# Patient Record
Sex: Female | Born: 1990 | Race: White | Hispanic: No | Marital: Single | Smoking: Heavy tobacco smoker
Health system: Southern US, Community
[De-identification: ages and names within clinical notes are randomized; demographics above are authoritative.]

---

## 2019-11-09 ENCOUNTER — Encounter (HOSPITAL_COMMUNITY): Payer: Self-pay | Admitting: Licensed Clinical Social Worker

## 2019-11-09 ENCOUNTER — Other Ambulatory Visit: Payer: Self-pay

## 2019-11-09 ENCOUNTER — Ambulatory Visit (INDEPENDENT_AMBULATORY_CARE_PROVIDER_SITE_OTHER): Payer: No Payment, Other | Admitting: Licensed Clinical Social Worker

## 2019-11-09 DIAGNOSIS — F331 Major depressive disorder, recurrent, moderate: Secondary | ICD-10-CM | POA: Diagnosis not present

## 2019-11-09 DIAGNOSIS — F900 Attention-deficit hyperactivity disorder, predominantly inattentive type: Secondary | ICD-10-CM | POA: Insufficient documentation

## 2019-11-09 DIAGNOSIS — F909 Attention-deficit hyperactivity disorder, unspecified type: Secondary | ICD-10-CM | POA: Insufficient documentation

## 2019-11-09 DIAGNOSIS — F411 Generalized anxiety disorder: Secondary | ICD-10-CM | POA: Insufficient documentation
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
## 2019-11-11 ENCOUNTER — Encounter (HOSPITAL_COMMUNITY): Payer: Self-pay | Admitting: Psychiatry

## 2019-11-11 ENCOUNTER — Other Ambulatory Visit: Payer: Self-pay

## 2019-11-11 ENCOUNTER — Telehealth (INDEPENDENT_AMBULATORY_CARE_PROVIDER_SITE_OTHER): Payer: No Payment, Other | Admitting: Psychiatry

## 2019-11-11 DIAGNOSIS — F3342 Major depressive disorder, recurrent, in full remission: Secondary | ICD-10-CM | POA: Diagnosis not present

## 2019-11-11 DIAGNOSIS — F900 Attention-deficit hyperactivity disorder, predominantly inattentive type: Secondary | ICD-10-CM | POA: Diagnosis not present

## 2019-11-11 MED ORDER — ESCITALOPRAM OXALATE 10 MG PO TABS: 10.0000 mg | ORAL_TABLET | Freq: Every day | ORAL | 2 refills | Status: DC

## 2019-11-11 MED ORDER — AMPHETAMINE-DEXTROAMPHET ER 10 MG PO CP24: 10.0000 mg | ORAL_CAPSULE | Freq: Every morning | ORAL | 0 refills | Status: DC

## 2019-11-11 NOTE — Progress Notes (Addendum)
Psychiatric Initial Adult Assessment   Virtual Visit via Video Note  I connected with Madison Chavez on 11/11/19 at  1:40 PM EDT by a video enabled telemedicine application and verified that I am speaking with the correct person using two identifiers.  Location: Patient: Home Provider: Clinic   I discussed the limitations of evaluation and management by telemedicine and the availability of in person appointments. The patient expressed understanding and agreed to proceed.  Names of all persons participating in this telemedicine service and their role in this encounter. Name: Dr. Evelene Croon Role: Psychiatrist  Name: Madison Chavez    Role: Patient           I provided  29 minutes of non-face-to-face time during this encounter.     Patient Identification: Madison Chavez MRN:  [id number] Date of Evaluation:  11/11/2019   Referral Source: Vesta Mixer  Chief Complaint:   " I am a refill from her Lexapro and also need to discuss treatment for ADHD."  Visit Diagnosis:    ICD-10-CM   1. MDD (major depressive disorder), recurrent, in full remission (HCC)  F33.42 escitalopram (LEXAPRO) 10 MG tablet  2. ADHD, predominantly inattentive type  F90.0 amphetamine-dextroamphetamine (ADDERALL XR) 10 MG 24 hr capsule    amphetamine-dextroamphetamine (ADDERALL XR) 10 MG 24 hr capsule    History of Present Illness: This is a 29 year old female with history of MDD and recently diagnosed ADHD now seen for evaluation and establishing care.  Patient was previously being managed at [facility] and was being prescribed Lexapro 10 mg daily. Patient reported that she has been on Lexapro for about 1-1/2 years now and it has been very helpful.  She informed that couple of years ago she started seeking help for her psychological issues and at that time she was misdiagnosed with bipolar disorder and prescribed Vraylar by a provider in [place].  Vraylar did not help her and caused her a lot of side effects.  She  was eventually taken off of it and then placed on Lexapro and her diagnosis was revised to MDD.  Patient informed that she has done really well with Lexapro and would like to continue taking it at the same dose. She informed that she has always struggled with focusing and poor concentration issues.  She always has had a hard time completing her assignments in a timely fashion and waiting for returns.  She recently underwent psychological testing to rule out adhd at [email or web address].  She informed that she has the report with her and she can email it to the providers office for review. She stated that she would like to try a medication to help her with focusing.  She reported that her symptoms of depression are in remission now, she denied any anhedonia or depressed mood.  She denies any suicide attempts, she denied any suicidal or homicidal ideations.  She denied any medical issues.  She denied any history of cardiac issues.  She denied any ongoing use of illicit substances like cocaine or opioids.  She did report that she smokes marijuana 1-2 times per week, started using it about 6 months ago mainly to help herself relax and sleep better.  She stated that she is planning to discontinue using it completely. She denied excessive consumption of alcohol.  She denied any history of manic or hypomanic symptoms.  She denied any hallucinations or delusions.  She denied any symptom suggestive of PTSD such as flashbacks or nightmares.  She informed that she has 2 jobs.  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  She also works at [employer] on a part-time basis. She informed that she has a fever members who have ADHD symptoms but no one has been formally diagnosed.   Past Psychiatric History: MDD, recently diagnosed ADHD  Previous Psychotropic Medications: No   Substance Abuse History in the last 12 months:  No.  Consequences of Substance Abuse: NA  Past Medical History:  denied  Family Psychiatric History: Undiagnosed history of mood disorder, ADHD  Family History: No family history on file.  Social History:   Social History   Socioeconomic History  . Marital status: Single    Spouse name: Not on file  . Number of children: Not on file  . Years of education: Not on file  . Highest education level: Not on file  Occupational History  . Not on file  Tobacco Use  . Smoking status: Heavy Tobacco Smoker    Types: E-cigarettes  . Smokeless tobacco: Never Used  Substance and Sexual Activity  . Alcohol use: Yes    Comment: social   . Drug use: Yes    Types: Marijuana    Comment: joint a day   . Sexual activity: Never  Other Topics Concern  . Not on file  Social History Narrative  . Not on file   Social Determinants of Health   Financial Resource Strain: Low Risk   . Difficulty of Paying Living Expenses: Not very hard  Food Insecurity: No Food Insecurity  . Worried About Programme researcher, broadcasting/film/video in the Last Year: Never true  . Ran Out of Food in the Last Year: Never true  Transportation Needs: No Transportation Needs  . Lack of Transportation (Medical): No  . Lack of Transportation (Non-Medical): No  Physical Activity: Sufficiently Active  . Days of Exercise per Week: 4 days  . Minutes of Exercise per Session: 150+ min  Stress: Stress Concern Present  . Feeling of Stress : To some extent  Social Connections: Moderately Isolated  . Frequency of Communication with Friends and Family: More than three times a week  . Frequency of Social Gatherings with Friends and Family: Twice a week  . Attends Religious Services: More than 4 times per year  . Active Member of Clubs or Organizations: No  . Attends Banker Meetings: Never  . Marital Status: Never married    Additional Social History: See HPI  Allergies:  Not on File  Metabolic Disorder Labs: No results found for: HGBA1C, MPG No results found for: PROLACTIN No results found  for: CHOL, TRIG, HDL, CHOLHDL, VLDL, LDLCALC No results found for: TSH  Therapeutic Level Labs: No results found for: LITHIUM No results found for: CBMZ No results found for: VALPROATE  Current Medications: Current Outpatient Medications  Medication Sig Dispense Refill  . amphetamine-dextroamphetamine (ADDERALL XR) 10 MG 24 hr capsule Take 1 capsule (10 mg total) by mouth in the morning. 30 capsule 0  . [START ON 12/10/2019] amphetamine-dextroamphetamine (ADDERALL XR) 10 MG 24 hr capsule Take 1 capsule (10 mg total) by mouth in the morning. 30 capsule 0  . escitalopram (LEXAPRO) 10 MG tablet Take 1 tablet (10 mg total) by mouth daily. 30 tablet 2   No current facility-administered medications for this visit.     Psychiatric Specialty Exam: Review of Systems  There were no vitals taken for this visit.There is no height or weight on file to calculate BMI.  General Appearance:  Fairly Groomed  Eye Contact:  Good  Speech:  Clear and Coherent and Normal Rate  Volume:  Normal  Mood:  Euthymic  Affect:  Congruent  Thought Process:  Goal Directed and Descriptions of Associations: Intact  Orientation:  Full (Time, Place, and Person)  Thought Content:  Logical  Suicidal Thoughts:  No  Homicidal Thoughts:  No  Memory:  Immediate;   Good Recent;   Good  Judgement:  Fair  Insight:  Fair  Psychomotor Activity:  Normal  Concentration:  Concentration: Good and Attention Span: Good  Recall:  Good  Fund of Knowledge:Good  Language: Good  Akathisia:  Negative  Handed:  Right  AIMS (if indicated):  not done  Assets:  Communication Skills Desire for Improvement Financial Resources/Insurance Housing Social Support  ADL's:  Intact  Cognition: WNL  Sleep:  Fair   Screenings: AUDIT     Counselor from 11/09/2019 in [facility]  Alcohol Use Disorder Identification Test Final Score (AUDIT) 3    PHQ2-9     Counselor from 11/09/2019 in [facility]  PHQ-2 Total Score 2  PHQ-9 Total Score 12      Assessment and Plan: Based on patient's history and evaluation, her symptoms of MDD seem to be in remission.  She has been recently diagnosed with ADHD.  She does not have history of concerning substance use disorder and also is denying any medical history including cardiac issues.  Based on this we will prescribe Adderall XR 10 mg every morning to target her ADHD symptoms.  We will continue Lexapro 10 mg daily.  1. MDD (major depressive disorder), recurrent, in full remission (HCC)  - escitalopram (LEXAPRO) 10 MG tablet; Take 1 tablet (10 mg total) by mouth daily.  Dispense: 30 tablet; Refill: 2  2. ADHD, predominantly inattentive type  - Start amphetamine-dextroamphetamine (ADDERALL XR) 10 MG 24 hr capsule; Take 1 capsule (10 mg total) by mouth in the morning.  Dispense: 30 capsule; Refill: 0 - amphetamine-dextroamphetamine (ADDERALL XR) 10 MG 24 hr capsule; Take 1 capsule (10 mg total) by mouth in the morning.  Dispense: 30 capsule; Refill: 0  F/up in 2 months.  Zena Amos, MD 11/3/20211:49 PM

## 2020-01-12 ENCOUNTER — Telehealth (INDEPENDENT_AMBULATORY_CARE_PROVIDER_SITE_OTHER): Payer: No Payment, Other | Admitting: Psychiatry

## 2020-01-12 ENCOUNTER — Encounter (HOSPITAL_COMMUNITY): Payer: Self-pay | Admitting: Psychiatry

## 2020-01-12 ENCOUNTER — Other Ambulatory Visit: Payer: Self-pay

## 2020-01-12 DIAGNOSIS — F900 Attention-deficit hyperactivity disorder, predominantly inattentive type: Secondary | ICD-10-CM

## 2020-01-12 DIAGNOSIS — F3342 Major depressive disorder, recurrent, in full remission: Secondary | ICD-10-CM

## 2020-01-12 MED ORDER — AMPHETAMINE-DEXTROAMPHET ER 20 MG PO CP24: 20.0000 mg | ORAL_CAPSULE | Freq: Every morning | ORAL | 0 refills | Status: DC

## 2020-01-12 MED ORDER — ESCITALOPRAM OXALATE 10 MG PO TABS: 10.0000 mg | ORAL_TABLET | Freq: Every day | ORAL | 1 refills | Status: DC

## 2020-01-12 NOTE — Progress Notes (Signed)
BH OP Progress Note   Virtual Visit via Video Note  I connected with Madison Chavez on 01/12/20 at  3:40 PM EST by a video enabled telemedicine application and verified that I am speaking with the correct person using two identifiers.  Location: Patient: Home Provider: Clinic   I discussed the limitations of evaluation and management by telemedicine and the availability of in person appointments. The patient expressed understanding and agreed to proceed.    I provided 16 minutes of non-face-to-face time during this encounter.     Patient Identification: Madison Chavez MRN:  [id number] Date of Evaluation:  01/12/2020   Referral Source: Madison Chavez  Chief Complaint:   "I am doing well."  Visit Diagnosis:    ICD-10-CM   1. MDD (major depressive disorder), recurrent, in full remission (HCC)  F33.42   2. ADHD, predominantly inattentive type  F90.0     History of Present Illness: Patient reported that she is doing well overall.  She informed that she found Adderall XR to be quite helpful.  She stated that it has helped her with her social anxiety as well and she feels she is more sociable now.  She stated that the medicine does wear off kind of early in the day prior to lunchtime and she thinks she can benefit by trying a higher dose. She stated she feels that her dose of Lexapro is working well. She informed that she has a friend who also takes the same medicine Adderall like her and that she does not take the medicine on the weekends and if she can also do that. She also informed that she has been seeing a counselor 2 times per month regularly at [facility] and she likes them.  She denied any side effects to her Adderall XR and is agreeable to going up on the dose to 20 mg for optimal effect.  Past Psychiatric History: MDD, recently diagnosed ADHD  Previous Psychotropic Medications: No   Substance Abuse History in the last 12 months:  No.  Consequences of  Substance Abuse: NA  Past Medical History: denied  Family Psychiatric History: Undiagnosed history of mood disorder, ADHD  Family History: No family history on file.  Social History:   Social History   Socioeconomic History  . Marital status: Single    Spouse name: Not on file  . Number of children: Not on file  . Years of education: Not on file  . Highest education level: Not on file  Occupational History  . Not on file  Tobacco Use  . Smoking status: Heavy Tobacco Smoker    Types: E-cigarettes  . Smokeless tobacco: Never Used  Substance and Sexual Activity  . Alcohol use: Yes    Comment: social   . Drug use: Yes    Types: Marijuana    Comment: joint a day   . Sexual activity: Never  Other Topics Concern  . Not on file  Social History Narrative  . Not on file   Social Determinants of Health   Financial Resource Strain: Low Risk   . Difficulty of Paying Living Expenses: Not very hard  Food Insecurity: No Food Insecurity  . Worried About Programme researcher, broadcasting/film/video in the Last Year: Never true  . Ran Out of Food in the Last Year: Never true  Transportation Needs: No Transportation Needs  . Lack of Transportation (Medical): No  . Lack of Transportation (Non-Medical): No  Physical Activity: Sufficiently Active  . Days of Exercise per  Week: 4 days  . Minutes of Exercise per Session: 150+ min  Stress: Stress Concern Present  . Feeling of Stress : To some extent  Social Connections: Moderately Isolated  . Frequency of Communication with Friends and Family: More than three times a week  . Frequency of Social Gatherings with Friends and Family: Twice a week  . Attends Religious Services: More than 4 times per year  . Active Member of Clubs or Organizations: No  . Attends Archivist Meetings: Never  . Marital Status: Never married    Additional Social History: See HPI  Allergies:  Not on File  Metabolic Disorder Labs: No results found for: HGBA1C, MPG No  results found for: PROLACTIN No results found for: CHOL, TRIG, HDL, CHOLHDL, VLDL, LDLCALC No results found for: TSH  Therapeutic Level Labs: No results found for: LITHIUM No results found for: CBMZ No results found for: VALPROATE  Current Medications: Current Outpatient Medications  Medication Sig Dispense Refill  . amphetamine-dextroamphetamine (ADDERALL XR) 10 MG 24 hr capsule Take 1 capsule (10 mg total) by mouth in the morning. 30 capsule 0  . amphetamine-dextroamphetamine (ADDERALL XR) 10 MG 24 hr capsule Take 1 capsule (10 mg total) by mouth in the morning. 30 capsule 0  . escitalopram (LEXAPRO) 10 MG tablet Take 1 tablet (10 mg total) by mouth daily. 30 tablet 2   No current facility-administered medications for this visit.     Psychiatric Specialty Exam: Review of Systems  There were no vitals taken for this visit.There is no height or weight on file to calculate BMI.  General Appearance: Fairly Groomed  Eye Contact:  Good  Speech:  Clear and Coherent and Normal Rate  Volume:  Normal  Mood:  Euthymic  Affect:  Congruent  Thought Process:  Goal Directed and Descriptions of Associations: Intact  Orientation:  Full (Time, Place, and Person)  Thought Content:  Logical  Suicidal Thoughts:  No  Homicidal Thoughts:  No  Memory:  Immediate;   Good Recent;   Good  Judgement:  Fair  Insight:  Fair  Psychomotor Activity:  Normal  Concentration:  Concentration: Good and Attention Span: Good  Recall:  Good  Fund of Knowledge:Good  Language: Good  Akathisia:  Negative  Handed:  Right  AIMS (if indicated):  not done  Assets:  Communication Skills Desire for Improvement Financial Resources/Insurance Housing Social Support  ADL's:  Intact  Cognition: WNL  Sleep:  Fair   Screenings: AUDIT   Health and safety inspector from 11/09/2019 in [facility]  Alcohol Use Disorder Identification Test Final Score (AUDIT) 3    PHQ2-9   Flowsheet Row  Counselor from 11/09/2019 in [facility]  PHQ-2 Total Score 2  PHQ-9 Total Score 12      Assessment and Plan: Patient appears to be doing well on her current regimen however mentioned that Adderall XR 10 mg dose wears off early in the day and is agreeable to going up on the dose for optimal effect.  1. MDD (major depressive disorder), recurrent, in full remission ([place])  - escitalopram (LEXAPRO) 10 MG tablet; Take 1 tablet (10 mg total) by mouth daily.  Dispense: 30 tablet; Refill: 1  2. ADHD, predominantly inattentive type  - Increase amphetamine-dextroamphetamine (ADDERALL XR) 20 MG 24 hr capsule; Take 1 capsule (20 mg total) by mouth in the morning.  Dispense: 30 capsule; Refill: 0 - amphetamine-dextroamphetamine (ADDERALL XR) 20 MG 24 hr capsule; Take 1 capsule (20  mg total) by mouth in the morning.  Dispense: 30 capsule; Refill: 0  F/up in 2 months. Continue counseling with therapist at [facility].  Zena Amos, MD 1/4/20223:48 PM

## 2020-03-10 ENCOUNTER — Encounter (HOSPITAL_COMMUNITY): Payer: Self-pay | Admitting: Psychiatry

## 2020-03-10 ENCOUNTER — Other Ambulatory Visit: Payer: Self-pay

## 2020-03-10 ENCOUNTER — Telehealth (INDEPENDENT_AMBULATORY_CARE_PROVIDER_SITE_OTHER): Payer: No Payment, Other | Admitting: Psychiatry

## 2020-03-10 DIAGNOSIS — F900 Attention-deficit hyperactivity disorder, predominantly inattentive type: Secondary | ICD-10-CM | POA: Diagnosis not present

## 2020-03-10 DIAGNOSIS — F3342 Major depressive disorder, recurrent, in full remission: Secondary | ICD-10-CM | POA: Diagnosis not present

## 2020-03-10 MED ORDER — AMPHETAMINE-DEXTROAMPHET ER 20 MG PO CP24: 20.0000 mg | ORAL_CAPSULE | Freq: Every morning | ORAL | 0 refills | Status: DC

## 2020-03-10 MED ORDER — ESCITALOPRAM OXALATE 10 MG PO TABS: 10.0000 mg | ORAL_TABLET | Freq: Every day | ORAL | 2 refills | Status: DC

## 2020-03-10 NOTE — Progress Notes (Signed)
BH OP Progress Note   Virtual Visit via Video Note  I connected with [name] on 03/10/20 at  3:20 PM EST by a video enabled telemedicine application and verified that I am speaking with the correct person using two identifiers.  Location: Patient:  Car Provider: Clinic   I discussed the limitations of evaluation and management by telemedicine and the availability of in person appointments. The patient expressed understanding and agreed to proceed.  I provided 14 minutes of non-face-to-face time during this encounter.    Patient Identification: Elzina Devera MRN:  [id number] Date of Evaluation:  03/10/2020    Chief Complaint:   " I am doing very well now."  Visit Diagnosis:    ICD-10-CM   1. MDD (major depressive disorder), recurrent, in full remission (HCC)  F33.42   2. ADHD, predominantly inattentive type  F90.0     History of Present Illness: Patient reported that she is doing really well.  She informed that increasing the dose of Adderall XR was very helpful and she has noticed significant improvement in her concentration and her mood.  She stated that initially when she started taking the higher dose she had noticed some difficulty with sleep and also noticed increase in anxiety.  However that subsided after a few days and she feels she is doing well now. She still taking Lexapro regularly.  She has been seeing her therapist regularly. She still works at the same place and things are going well.   Past Psychiatric History: MDD, recently diagnosed ADHD  Previous Psychotropic Medications: No   Substance Abuse History in the last 12 months:  No.  Consequences of Substance Abuse: NA  Past Medical History: denied  Family Psychiatric History: Undiagnosed history of mood disorder, ADHD  Family History: No family history on file.  Social History:   Social History   Socioeconomic History  . Marital status: Single    Spouse name: Not on file  . Number of  children: Not on file  . Years of education: Not on file  . Highest education level: Not on file  Occupational History  . Not on file  Tobacco Use  . Smoking status: Heavy Tobacco Smoker    Types: E-cigarettes  . Smokeless tobacco: Never Used  Substance and Sexual Activity  . Alcohol use: Yes    Comment: social   . Drug use: Yes    Types: Marijuana    Comment: joint a day   . Sexual activity: Never  Other Topics Concern  . Not on file  Social History Narrative  . Not on file   Social Determinants of Health   Financial Resource Strain: Low Risk   . Difficulty of Paying Living Expenses: Not very hard  Food Insecurity: No Food Insecurity  . Worried About Programme researcher, broadcasting/film/video in the Last Year: Never true  . Ran Out of Food in the Last Year: Never true  Transportation Needs: No Transportation Needs  . Lack of Transportation (Medical): No  . Lack of Transportation (Non-Medical): No  Physical Activity: Sufficiently Active  . Days of Exercise per Week: 4 days  . Minutes of Exercise per Session: 150+ min  Stress: Stress Concern Present  . Feeling of Stress : To some extent  Social Connections: Moderately Isolated  . Frequency of Communication with Friends and Family: More than three times a week  . Frequency of Social Gatherings with Friends and Family: Twice a week  . Attends Religious Services: More than 4  times per year  . Active Member of Clubs or Organizations: No  . Attends Banker Meetings: Never  . Marital Status: Never married    Additional Social History: See HPI  Allergies:  Not on File  Metabolic Disorder Labs: No results found for: HGBA1C, MPG No results found for: PROLACTIN No results found for: CHOL, TRIG, HDL, CHOLHDL, VLDL, LDLCALC No results found for: TSH  Therapeutic Level Labs: No results found for: LITHIUM No results found for: CBMZ No results found for: VALPROATE  Current Medications: Current Outpatient Medications  Medication  Sig Dispense Refill  . amphetamine-dextroamphetamine (ADDERALL XR) 20 MG 24 hr capsule Take 1 capsule (20 mg total) by mouth in the morning. 30 capsule 0  . amphetamine-dextroamphetamine (ADDERALL XR) 20 MG 24 hr capsule Take 1 capsule (20 mg total) by mouth in the morning. 30 capsule 0  . escitalopram (LEXAPRO) 10 MG tablet Take 1 tablet (10 mg total) by mouth daily. 30 tablet 1   No current facility-administered medications for this visit.     Psychiatric Specialty Exam: Review of Systems  There were no vitals taken for this visit.There is no height or weight on file to calculate BMI.  General Appearance: Fairly Groomed, wearing work uniform  Patent attorney:  Good  Speech:  Clear and Coherent and Normal Rate  Volume:  Normal  Mood:  Euthymic  Affect:  Congruent  Thought Process:  Goal Directed and Descriptions of Associations: Intact  Orientation:  Full (Time, Place, and Person)  Thought Content:  Logical  Suicidal Thoughts:  No  Homicidal Thoughts:  No  Memory:  Immediate;   Good Recent;   Good  Judgement:  Fair  Insight:  Fair  Psychomotor Activity:  Normal  Concentration:  Concentration: Good and Attention Span: Good  Recall:  Good  Fund of Knowledge:Good  Language: Good  Akathisia:  Negative  Handed:  Right  AIMS (if indicated):  not done  Assets:  Communication Skills Desire for Improvement Financial Resources/Insurance Housing Social Support  ADL's:  Intact  Cognition: WNL  Sleep:  Fair   Screenings: AUDIT   Advertising copywriter from 11/09/2019 in [facility]  Alcohol Use Disorder Identification Test Final Score (AUDIT) 3    PHQ2-9   Flowsheet Row Counselor from 11/09/2019 in [facility]  PHQ-2 Total Score 2  PHQ-9 Total Score 12      Assessment and Plan: Patient seems to be stable on her current regimen.  1. MDD (major depressive disorder), recurrent, in full remission (HCC)  - escitalopram  (LEXAPRO) 10 MG tablet; Take 1 tablet (10 mg total) by mouth daily.  Dispense: 30 tablet; Refill: 2  2. ADHD, predominantly inattentive type  - amphetamine-dextroamphetamine (ADDERALL XR) 20 MG 24 hr capsule; Take 1 capsule (20 mg total) by mouth in the morning.  Dispense: 30 capsule; Refill: 0 - amphetamine-dextroamphetamine (ADDERALL XR) 20 MG 24 hr capsule; Take 1 capsule (20 mg total) by mouth in the morning.  Dispense: 30 capsule; Refill: 0 - amphetamine-dextroamphetamine (ADDERALL XR) 20 MG 24 hr capsule; Take 1 capsule (20 mg total) by mouth in the morning.  Dispense: 30 capsule; Refill: 0  Continue individual therapy.  Continue same medication regimen. Follow up in 3 months.   Zena Amos, MD 3/3/20223:18 PM

## 2020-03-11 ENCOUNTER — Other Ambulatory Visit: Payer: Self-pay

## 2020-03-11 ENCOUNTER — Emergency Department (HOSPITAL_BASED_OUTPATIENT_CLINIC_OR_DEPARTMENT_OTHER)
Admission: EM | Admit: 2020-03-11 | Discharge: 2020-03-11 | Disposition: A | Discharge status: Home / Self Care | Payer: No Typology Code available for payment source | Attending: Emergency Medicine | Admitting: Emergency Medicine

## 2020-03-11 ENCOUNTER — Emergency Department (HOSPITAL_BASED_OUTPATIENT_CLINIC_OR_DEPARTMENT_OTHER): Payer: No Typology Code available for payment source

## 2020-03-11 ENCOUNTER — Encounter (HOSPITAL_BASED_OUTPATIENT_CLINIC_OR_DEPARTMENT_OTHER): Payer: Self-pay | Admitting: Emergency Medicine

## 2020-03-11 DIAGNOSIS — F17290 Nicotine dependence, other tobacco product, uncomplicated: Secondary | ICD-10-CM | POA: Insufficient documentation

## 2020-03-11 DIAGNOSIS — Y92410 Unspecified street and highway as the place of occurrence of the external cause: Secondary | ICD-10-CM | POA: Insufficient documentation

## 2020-03-11 DIAGNOSIS — S92301A Fracture of unspecified metatarsal bone(s), right foot, initial encounter for closed fracture: Secondary | ICD-10-CM | POA: Diagnosis not present

## 2020-03-11 DIAGNOSIS — S99921A Unspecified injury of right foot, initial encounter: Secondary | ICD-10-CM | POA: Diagnosis present

## 2020-03-11 MED ORDER — ONDANSETRON 4 MG PO TBDP: 4.0000 mg | ORAL_TABLET | Freq: Three times a day (TID) | ORAL | 0 refills | Status: AC | PRN

## 2020-03-11 MED ORDER — HYDROCODONE-ACETAMINOPHEN 5-325 MG PO TABS: 1.0000 | ORAL_TABLET | Freq: Four times a day (QID) | ORAL | 0 refills | Status: AC | PRN
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
## 2020-03-11 NOTE — Discharge Instructions (Addendum)
You can take Tylenol or Ibuprofen as directed for pain. You can alternate Tylenol and Ibuprofen every 4 hours. If you take Tylenol at 1pm, then you can take Ibuprofen at 5pm. Then you can take Tylenol again at 9pm.   Take pain medications as directed for break through pain. Do not drive or operate machinery while taking this medication.   Follow the RICE (Rest, Ice, Compression, Elevation) protocol as directed.   As we discussed, use the cam walker boot as directed.  You can be nonweightbearing as tolerated.  If you feel that you cannot bear weight, use the crutches.  As we discussed, you will need to follow-up with referred orthopedic doctor.  Please call their office and arrange for an appointment.  Return to emergency department for any worsening pain, swelling, numbness/weakness.

## 2020-06-01 ENCOUNTER — Telehealth (HOSPITAL_COMMUNITY): Payer: No Payment, Other | Admitting: Psychiatry

## 2020-06-01 ENCOUNTER — Other Ambulatory Visit: Payer: Self-pay

## 2020-06-21 ENCOUNTER — Encounter (HOSPITAL_COMMUNITY): Payer: Self-pay | Admitting: Psychiatry

## 2020-06-21 ENCOUNTER — Telehealth (INDEPENDENT_AMBULATORY_CARE_PROVIDER_SITE_OTHER): Payer: No Payment, Other | Admitting: Psychiatry

## 2020-06-21 DIAGNOSIS — F3342 Major depressive disorder, recurrent, in full remission: Secondary | ICD-10-CM

## 2020-06-21 DIAGNOSIS — F900 Attention-deficit hyperactivity disorder, predominantly inattentive type: Secondary | ICD-10-CM | POA: Diagnosis not present

## 2020-06-21 MED ORDER — AMPHETAMINE-DEXTROAMPHET ER 20 MG PO CP24: 20.0000 mg | ORAL_CAPSULE | Freq: Every morning | ORAL | 0 refills | Status: DC

## 2020-06-21 MED ORDER — ESCITALOPRAM OXALATE 10 MG PO TABS: 10.0000 mg | ORAL_TABLET | Freq: Every day | ORAL | 2 refills | Status: DC

## 2020-06-21 MED ORDER — AMPHETAMINE-DEXTROAMPHET ER 20 MG PO CP24
20.0000 mg | ORAL_CAPSULE | Freq: Every morning | ORAL | 0 refills | Status: DC
Start: 2020-06-21 — End: 2020-09-16

## 2020-06-21 NOTE — Progress Notes (Signed)
BH OP Progress Note   Virtual Visit via Video Note  I connected with [name] on 06/21/20 at  9:00 AM EDT by a video enabled telemedicine application and verified that I am speaking with the correct person using two identifiers.  Location: Patient: Home Provider: Clinic   I discussed the limitations of evaluation and management by telemedicine and the availability of in person appointments. The patient expressed understanding and agreed to proceed.  I provided 15 minutes of non-face-to-face time during this encounter.     Patient Identification: Madison Chavez MRN:  [id number] Date of Evaluation:  06/21/2020    Chief Complaint:   "Things going pretty well."  Visit Diagnosis:    ICD-10-CM   1. MDD (major depressive disorder), recurrent, in full remission (HCC)  F33.42 escitalopram (LEXAPRO) 10 MG tablet    2. ADHD, predominantly inattentive type  F90.0 amphetamine-dextroamphetamine (ADDERALL XR) 20 MG 24 hr capsule    amphetamine-dextroamphetamine (ADDERALL XR) 20 MG 24 hr capsule    amphetamine-dextroamphetamine (ADDERALL XR) 20 MG 24 hr capsule      History of Present Illness: Patient reported she is doing well.  She apologized for missing her last scheduled appointment due to her work schedule.  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  She stated that work has been busy lately. She is also applied for another job and has completed the interview process and is waiting to hear back from them.  She denies any other stressors or issues at present. Her current medication doses are working well and she does not think she needs any adjustments at present.  She informed that she forgot to pick up her Lexapro in a timely fashion and as a result she ran out for a few days and could tell a big difference however as soon as she got back on it she was fine. She is sleeping well at night.   Past Psychiatric History: MDD, recently diagnosed ADHD  Previous  Psychotropic Medications: No   Substance Abuse History in the last 12 months:  No.  Consequences of Substance Abuse: NA  Past Medical History: denied  Family Psychiatric History: Undiagnosed history of mood disorder, ADHD  Family History: History reviewed. No pertinent family history.  Social History:   Social History   Socioeconomic History   Marital status: Single    Spouse name: Not on file   Number of children: Not on file   Years of education: Not on file   Highest education level: Not on file  Occupational History   Not on file  Tobacco Use   Smoking status: Heavy Smoker    Pack years: 0.00    Types: E-cigarettes   Smokeless tobacco: Never  Substance and Sexual Activity   Alcohol use: Yes    Comment: social    Drug use: Yes    Types: Marijuana    Comment: joint a day    Sexual activity: Never  Other Topics Concern   Not on file  Social History Narrative   Not on file   Social Determinants of Health   Financial Resource Strain: Low Risk    Difficulty of Paying Living Expenses: Not very hard  Food Insecurity: No Food Insecurity   Worried About Running Out of Food in the Last Year: Never true   Ran Out of Food in the Last Year: Never true  Transportation Needs: No Transportation Needs   Lack of Transportation (Medical): No   Lack of Transportation (  Non-Medical): No  Physical Activity: Sufficiently Active   Days of Exercise per Week: 4 days   Minutes of Exercise per Session: 150+ min  Stress: Stress Concern Present   Feeling of Stress : To some extent  Social Connections: Moderately Isolated   Frequency of Communication with Friends and Family: More than three times a week   Frequency of Social Gatherings with Friends and Family: Twice a week   Attends Religious Services: More than 4 times per year   Active Member of [employer] or Organizations: No   Attends Engineer, structural: Never   Marital Status: Never married    Additional Social History:  See HPI  Allergies:  No Known Allergies  Metabolic Disorder Labs: No results found for: HGBA1C, MPG No results found for: PROLACTIN No results found for: CHOL, TRIG, HDL, CHOLHDL, VLDL, LDLCALC No results found for: TSH  Therapeutic Level Labs: No results found for: LITHIUM No results found for: CBMZ No results found for: VALPROATE  Current Medications: Current Outpatient Medications  Medication Sig Dispense Refill   amphetamine-dextroamphetamine (ADDERALL XR) 20 MG 24 hr capsule Take 1 capsule (20 mg total) by mouth in the morning. 30 capsule 0   [START ON 07/21/2020] amphetamine-dextroamphetamine (ADDERALL XR) 20 MG 24 hr capsule Take 1 capsule (20 mg total) by mouth in the morning. 30 capsule 0   [START ON 08/20/2020] amphetamine-dextroamphetamine (ADDERALL XR) 20 MG 24 hr capsule Take 1 capsule (20 mg total) by mouth in the morning. 30 capsule 0   escitalopram (LEXAPRO) 10 MG tablet Take 1 tablet (10 mg total) by mouth daily. 30 tablet 2   HYDROcodone-acetaminophen (NORCO/VICODIN) 5-325 MG tablet Take 1-2 tablets by mouth every 6 (six) hours as needed. 6 tablet 0   ondansetron (ZOFRAN ODT) 4 MG disintegrating tablet Take 1 tablet (4 mg total) by mouth every 8 (eight) hours as needed for nausea or vomiting. 6 tablet 0   No current facility-administered medications for this visit.     Psychiatric Specialty Exam: Review of Systems  There were no vitals taken for this visit.There is no height or weight on file to calculate BMI.  General Appearance: Fairly Groomed, wearing work uniform  Patent attorney:  Good  Speech:  Clear and Coherent and Normal Rate  Volume:  Normal  Mood:  Euthymic  Affect:  Congruent  Thought Process:  Goal Directed and Descriptions of Associations: Intact  Orientation:  Full (Time, Place, and Person)  Thought Content:  Logical  Suicidal Thoughts:  No  Homicidal Thoughts:  No  Memory:  Immediate;   Good Recent;   Good  Judgement:  Fair  Insight:  Fair   Psychomotor Activity:  Normal  Concentration:  Concentration: Good and Attention Span: Good  Recall:  Good  Fund of Knowledge:Good  Language: Good  Akathisia:  Negative  Handed:  Right  AIMS (if indicated):  not done  Assets:  Communication Skills Desire for Improvement Financial Resources/Insurance Housing Social Support  ADL's:  Intact  Cognition: WNL  Sleep:  Good   Screenings: AUDIT    Advertising copywriter from 11/09/2019 in [facility]  Alcohol Use Disorder Identification Test Final Score (AUDIT) 3      PHQ2-9    Flowsheet Row Counselor from 11/09/2019 in [facility]  PHQ-2 Total Score 2  PHQ-9 Total Score 12      Flowsheet Row ED from 03/11/2020 in [facility]  C-SSRS RISK CATEGORY No Risk  Assessment and Plan: Pt is doing well on her current regimen.  1. MDD (major depressive disorder), recurrent, in full remission (HCC)  - escitalopram (LEXAPRO) 10 MG tablet; Take 1 tablet (10 mg total) by mouth daily.  Dispense: 30 tablet; Refill: 2  2. ADHD, predominantly inattentive type  - amphetamine-dextroamphetamine (ADDERALL XR) 20 MG 24 hr capsule; Take 1 capsule (20 mg total) by mouth in the morning.  Dispense: 30 capsule; Refill: 0 - amphetamine-dextroamphetamine (ADDERALL XR) 20 MG 24 hr capsule; Take 1 capsule (20 mg total) by mouth in the morning.  Dispense: 30 capsule; Refill: 0 - amphetamine-dextroamphetamine (ADDERALL XR) 20 MG 24 hr capsule; Take 1 capsule (20 mg total) by mouth in the morning.  Dispense: 30 capsule; Refill: 0  Continue individual therapy. Continue same medication regimen. Follow up in 3 months.  Patient was informed that her care is being transferred to a different provider as the writer is leaving the office.  She verbalized her understanding.  Zena Amos, MD 6/14/20228:57 AM

## 2020-09-16 ENCOUNTER — Other Ambulatory Visit: Payer: Self-pay

## 2020-09-16 ENCOUNTER — Encounter (HOSPITAL_COMMUNITY): Payer: Self-pay | Admitting: Physician Assistant

## 2020-09-16 ENCOUNTER — Telehealth (INDEPENDENT_AMBULATORY_CARE_PROVIDER_SITE_OTHER): Payer: No Payment, Other | Admitting: Physician Assistant

## 2020-09-16 DIAGNOSIS — F411 Generalized anxiety disorder: Secondary | ICD-10-CM | POA: Diagnosis not present

## 2020-09-16 DIAGNOSIS — F900 Attention-deficit hyperactivity disorder, predominantly inattentive type: Secondary | ICD-10-CM | POA: Diagnosis not present

## 2020-09-16 DIAGNOSIS — F3342 Major depressive disorder, recurrent, in full remission: Secondary | ICD-10-CM

## 2020-09-16 MED ORDER — AMPHETAMINE-DEXTROAMPHET ER 30 MG PO CP24: 30.0000 mg | ORAL_CAPSULE | Freq: Every morning | ORAL | 0 refills | Status: DC

## 2020-09-16 MED ORDER — BUSPIRONE HCL 7.5 MG PO TABS: 7.5000 mg | ORAL_TABLET | Freq: Two times a day (BID) | ORAL | 2 refills | Status: DC

## 2020-09-16 MED ORDER — ESCITALOPRAM OXALATE 10 MG PO TABS: 10.0000 mg | ORAL_TABLET | Freq: Every day | ORAL | 2 refills | Status: DC

## 2020-09-16 NOTE — Progress Notes (Addendum)
BH MD/PA/NP OP Progress Note  Virtual Visit via Video Note  I connected with Madison Chavez on 09/16/20 at  3:00 PM EDT by a video enabled telemedicine application and verified that I am speaking with the correct person using two identifiers.  Location: Patient:Home  Provider: Clinic   I discussed the limitations of evaluation and management by telemedicine and the availability of in person appointments. The patient expressed understanding and agreed to proceed.  Follow Up Instructions:   I discussed the assessment and treatment plan with the patient. The patient was provided an opportunity to ask questions and all were answered. The patient agreed with the plan and demonstrated an understanding of the instructions.   The patient was advised to call back or seek an in-person evaluation if the symptoms worsen or if the condition fails to improve as anticipated.  I provided 25 minutes of non-face-to-face time during this encounter.  Meta Hatchet, PA   09/16/2020 8:35 PM Laquana Ji Fairburn  MRN:  [id number]  Chief Complaint: Follow up and medication management  HPI:   Madison Chavez. Sinatra "Madison Chavez" is a 30 year old female with a past psychiatric history significant for generalized anxiety disorder, ADHD (predominantly inattentive type), and major depressive disorder who presents to [facility] via virtual video visit for follow-up and medication management.  Patient is currently being managed on the following medications:  Lexapro 10 mg daily Adderall XR 20 mg 24-hour capsule daily.  Patient reports that she feels like her Adderall is not helping.  She continues to struggle with distractibility and lack of focus.  She states that she originally started on 5 mg of Adderall then increased to 10 mg.  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  Patient states that  she also has issues with her Lexapro.  She reports that the medication is not as effective in managing her depressive symptoms as it was in the beginning.  She describes her depressive episodes being situational rather than episodic.  Patient endorses anxiety she rates a 7 out of 10.  Patient states that her anxiety is often accompanied by restlessness.  She reports that she has used hydroxyzine in the past for the management of her anxiety, but she says it puts her to sleep.  A PHQ-9 screen was performed with the patient scoring a 13.  A GAD-7 screen is also performed with the patient scoring a 13.  Patient is alert and oriented x4, calm, cooperative, and fully engaged in conversation during the encounter.  Patient endorses good mood.  Patient denies suicidal or homicidal ideations.  She further denies auditory or visual hallucinations and does not appear to be responding to internal/external stimuli.  Patient endorses fair sleep and receives on average 4 to 5 hours of sleep each night.  Patient endorses decreased appetite when taking her Adderall.  She reports that she has struggled with binge eating in the past.  Patient eats on average 1-2 meals per day.  Patient denies alcohol consumption.  She further denies tobacco use but states that she does engage in vaping.  Patient endorses illicit drug use in the form of marijuana occasionally.  Visit Diagnosis:    ICD-10-CM   1. Generalized anxiety disorder  F41.1 escitalopram (LEXAPRO) 10 MG tablet    busPIRone (BUSPAR) 7.5 MG tablet    2. ADHD, predominantly inattentive type  F90.0 amphetamine-dextroamphetamine (ADDERALL XR) 30 MG  24 hr capsule    3. MDD (major depressive disorder), recurrent, in full remission (HCC)  F33.42 escitalopram (LEXAPRO) 10 MG tablet      Past Psychiatric History:  Generalized anxiety disorder ADHD, predominantly inattentive type Major depressive disorder  Past Medical History: No past medical history on file. No past  surgical history on file.  Family Psychiatric History:  Undiagnosed history of mood disorder, ADHD  Family History: No family history on file.  Social History:  Social History   Socioeconomic History   Marital status: Single    Spouse name: Not on file   Number of children: Not on file   Years of education: Not on file   Highest education level: Not on file  Occupational History   Not on file  Tobacco Use   Smoking status: Heavy Smoker    Types: E-cigarettes   Smokeless tobacco: Never  Substance and Sexual Activity   Alcohol use: Yes    Comment: social    Drug use: Yes    Types: Marijuana    Comment: joint a day    Sexual activity: Never  Other Topics Concern   Not on file  Social History Narrative   Not on file   Social Determinants of Health   Financial Resource Strain: Low Risk    Difficulty of Paying Living Expenses: Not very hard  Food Insecurity: No Food Insecurity   Worried About Running Out of Food in the Last Year: Never true   Ran Out of Food in the Last Year: Never true  Transportation Needs: No Transportation Needs   Lack of Transportation (Medical): No   Lack of Transportation (Non-Medical): No  Physical Activity: Sufficiently Active   Days of Exercise per Week: 4 days   Minutes of Exercise per Session: 150+ min  Stress: Stress Concern Present   Feeling of Stress : To some extent  Social Connections: Moderately Isolated   Frequency of Communication with Friends and Family: More than three times a week   Frequency of Social Gatherings with Friends and Family: Twice a week   Attends Religious Services: More than 4 times per year   Active Member of [employer] or Organizations: No   Attends Engineer, structural: Never   Marital Status: Never married    Allergies: No Known Allergies  Metabolic Disorder Labs: No results found for: HGBA1C, MPG No results found for: PROLACTIN No results found for: CHOL, TRIG, HDL, CHOLHDL, VLDL, LDLCALC No  results found for: TSH  Therapeutic Level Labs: No results found for: LITHIUM No results found for: VALPROATE No components found for:  CBMZ  Current Medications: Current Outpatient Medications  Medication Sig Dispense Refill   busPIRone (BUSPAR) 7.5 MG tablet Take 1 tablet (7.5 mg total) by mouth 2 (two) times daily. 60 tablet 2   amphetamine-dextroamphetamine (ADDERALL XR) 30 MG 24 hr capsule Take 1 capsule (30 mg total) by mouth in the morning. 30 capsule 0   escitalopram (LEXAPRO) 10 MG tablet Take 1 tablet (10 mg total) by mouth daily. 30 tablet 2   HYDROcodone-acetaminophen (NORCO/VICODIN) 5-325 MG tablet Take 1-2 tablets by mouth every 6 (six) hours as needed. 6 tablet 0   ondansetron (ZOFRAN ODT) 4 MG disintegrating tablet Take 1 tablet (4 mg total) by mouth every 8 (eight) hours as needed for nausea or vomiting. 6 tablet 0   No current facility-administered medications for this visit.     Musculoskeletal: Strength & Muscle Tone: within normal limits Gait & Station: normal Patient  leans: N/A  Psychiatric Specialty Exam: Review of Systems  Psychiatric/Behavioral:  Positive for decreased concentration and sleep disturbance. Negative for dysphoric mood, hallucinations, self-injury and suicidal ideas. The patient is nervous/anxious. The patient is not hyperactive.    There were no vitals taken for this visit.There is no height or weight on file to calculate BMI.  General Appearance: Well Groomed  Eye Contact:  Good  Speech:  Clear and Coherent and Normal Rate  Volume:  Normal  Mood:  Anxious and Euthymic  Affect:  Appropriate and Congruent  Thought Process:  Coherent, Goal Directed, and Descriptions of Associations: Intact  Orientation:  Full (Time, Place, and Person)  Thought Content: WDL   Suicidal Thoughts:  No  Homicidal Thoughts:  No  Memory:  Immediate;   Good Recent;   Good Remote;   Good  Judgement:  Good  Insight:  Good  Psychomotor Activity:  Normal   Concentration:  Concentration: Good and Attention Span: Good  Recall:  Good  Fund of Knowledge: Good  Language: Good  Akathisia:  NA  Handed:  Right  AIMS (if indicated): not done  Assets:  Communication Skills Desire for Improvement Financial Resources/Insurance Housing Social Support  ADL's:  Intact  Cognition: WNL  Sleep:  Fair   Screenings: AUDIT    Advertising copywriterlowsheet Row Counsel[facility]  Alcohol Use Disorder Identification Test Final Score (AUDIT) 3      GAD-7    Flow[facility]  Total GAD-7 Score 13      PHQ2-9    Flow[facility]  PHQ-2 Total Score 2 2  PHQ-9 Total Score 13 12      [facility]/11/2020 [facility]  C-SSRS RISK CATEGORY No Risk No Risk        Assessment and Plan:   Griffin Basilola M. Petitfrere "Madison Gambleolie" is a 30 year old female with a past psychiatric history significant for generalized anxiety disorder, ADHD (predominantly inattentive type), and major depressive disorder [facility] virtual video visit for follow-up and medication management.  Patient reports that her Adderall has not been effective in managing her attention and concentration.  She states that her lack of attention and concentration has been affecting her ability to work.  Patient also endorses situational depression that has not been managed by her antidepressant.  Patient continues to endorse worsening anxiety and states that she has tried hydroxyzine in the past with no relief from her symptoms.  Patient was recommended increasing her Adderall dosage from 20 mg to 30 mg daily  for the management of her ADHD symptoms.  Patient was also recommended adding on buspirone 7.5 mg 2 times daily for the management of her anxiety.  Patient was agreeable to recommendations.  Patient's medications to be e-prescribed to pharmacy of choice.  1. ADHD, predominantly inattentive type  - amphetamine-dextroamphetamine (ADDERALL XR) 30 MG 24 hr capsule; Take 1 capsule (30 mg total) by mouth in the morning.  Dispense: 30 capsule; Refill: 0  2. MDD (major depressive disorder), recurrent, in full remission (HCC)  - escitalopram (LEXAPRO) 10 MG tablet; Take 1 tablet (10 mg total) by mouth daily.  Dispense: 30 tablet; Refill: 2  3. Generalized anxiety  disorder  - escitalopram (LEXAPRO) 10 MG tablet; Take 1 tablet (10 mg total) by mouth daily.  Dispense: 30 tablet; Refill: 2 - busPIRone (BUSPAR) 7.5 MG tablet; Take 1 tablet (7.5 mg total) by mouth 2 (two) times daily.  Dispense: 60 tablet; Refill: 2  Patient to follow up in 3 months Provider spent a total of 25 minutes with the patient/reviewing patient's chart  Meta Hatchet, PA 09/16/2020, 8:35 PM

## 2020-10-27 ENCOUNTER — Other Ambulatory Visit (HOSPITAL_COMMUNITY): Payer: Self-pay | Admitting: Physician Assistant

## 2020-10-27 ENCOUNTER — Telehealth (HOSPITAL_COMMUNITY): Payer: Self-pay | Admitting: *Deleted

## 2020-10-27 DIAGNOSIS — F900 Attention-deficit hyperactivity disorder, predominantly inattentive type: Secondary | ICD-10-CM

## 2020-10-27 MED ORDER — AMPHETAMINE-DEXTROAMPHET ER 30 MG PO CP24: 30.0000 mg | ORAL_CAPSULE | Freq: Every morning | ORAL | 0 refills | Status: DC

## 2020-10-27 NOTE — Telephone Encounter (Signed)
Provider was contacted by Marlana Salvage. Polk, CMA regarding medication refill. Patient's medication to be refilled to pharmacy of choice.

## 2020-10-27 NOTE — Progress Notes (Signed)
Provider was contacted by Anna M. Polk, CMA regarding medication refill. Patient's medication to be refilled to pharmacy of choice.

## 2020-10-27 NOTE — Telephone Encounter (Signed)
Pt called requesting refills amphetamine-dextroamphetamine (ADDERALL XR) 30 MG 24 hr capsule

## 2020-11-30 ENCOUNTER — Other Ambulatory Visit (HOSPITAL_COMMUNITY): Payer: Self-pay | Admitting: Physician Assistant

## 2020-11-30 ENCOUNTER — Telehealth (HOSPITAL_COMMUNITY): Payer: Self-pay | Admitting: *Deleted

## 2020-11-30 DIAGNOSIS — F900 Attention-deficit hyperactivity disorder, predominantly inattentive type: Secondary | ICD-10-CM

## 2020-11-30 MED ORDER — AMPHETAMINE-DEXTROAMPHET ER 30 MG PO CP24
30.0000 mg | ORAL_CAPSULE | Freq: Every morning | ORAL | 0 refills | Status: DC
Start: 2020-11-30 — End: 2020-12-14

## 2020-11-30 NOTE — Progress Notes (Signed)
Provider was contacted by Suzanne K. Beck, RN regarding voicemail left by patient. Provider to be e-prescribe patient's medication to pharmacy of choice.

## 2020-11-30 NOTE — Telephone Encounter (Signed)
Provider was contacted by Orpah Clinton. Reola Calkins, RN regarding voicemail left by patient. Provider to be e-prescribe patient's medication to pharmacy of choice.

## 2020-11-30 NOTE — Telephone Encounter (Signed)
Call from patient requesting a new rx of her Adderall XR be called in. She should have run out of it about 4 days ago. Will notify Eddie PA of request.

## 2020-12-14 ENCOUNTER — Encounter (HOSPITAL_COMMUNITY): Payer: Self-pay | Admitting: Physician Assistant

## 2020-12-14 ENCOUNTER — Telehealth (INDEPENDENT_AMBULATORY_CARE_PROVIDER_SITE_OTHER): Payer: No Payment, Other | Admitting: Physician Assistant

## 2020-12-14 DIAGNOSIS — F900 Attention-deficit hyperactivity disorder, predominantly inattentive type: Secondary | ICD-10-CM | POA: Diagnosis not present

## 2020-12-14 DIAGNOSIS — F411 Generalized anxiety disorder: Secondary | ICD-10-CM | POA: Diagnosis not present

## 2020-12-14 DIAGNOSIS — F3342 Major depressive disorder, recurrent, in full remission: Secondary | ICD-10-CM

## 2020-12-14 MED ORDER — BUSPIRONE HCL 15 MG PO TABS: 15.0000 mg | ORAL_TABLET | Freq: Two times a day (BID) | ORAL | 2 refills | Status: DC

## 2020-12-14 MED ORDER — ESCITALOPRAM OXALATE 20 MG PO TABS: 20.0000 mg | ORAL_TABLET | Freq: Every day | ORAL | 2 refills | Status: DC

## 2020-12-14 MED ORDER — AMPHETAMINE-DEXTROAMPHET ER 30 MG PO CP24: 30.0000 mg | ORAL_CAPSULE | Freq: Every day | ORAL | 0 refills | Status: DC

## 2020-12-14 MED ORDER — AMPHETAMINE-DEXTROAMPHET ER 30 MG PO CP24
30.0000 mg | ORAL_CAPSULE | Freq: Every morning | ORAL | 0 refills | Status: DC
Start: 2021-01-04 — End: 2021-01-12

## 2020-12-14 NOTE — Progress Notes (Addendum)
BH MD/PA/NP OP Progress Note  Virtual Visit via Video Note  I connected with Madison Chavez on 12/14/20 at  2:00 PM EST by a video enabled telemedicine application and verified that I am speaking with the correct person using two identifiers.  Location: Patient: Home Provider: Clinic   I discussed the limitations of evaluation and management by telemedicine and the availability of in person appointments. The patient expressed understanding and agreed to proceed.  Follow Up Instructions:  I discussed the assessment and treatment plan with the patient. The patient was provided an opportunity to ask questions and all were answered. The patient agreed with the plan and demonstrated an understanding of the instructions.   The patient was advised to call back or seek an in-person evaluation if the symptoms worsen or if the condition fails to improve as anticipated.  I provided 24 minutes of non-face-to-face time during this encounter.  Meta Hatchet, PA    12/14/2020 11:39 PM Madison Chavez  MRN:  [id number]  Chief Complaint: Follow up and medication management  HPI:   Madison Gregg. [place] "Augusto Gamble" is a 30 year old female with a past psychiatric history significant for ADHD (inattentive type), generalized anxiety disorder, and major depressive disorder who presents to [facility] via virtual video visit for follow-up and medication management.  Patient is currently being managed on the following medications:  Adderall XR 30 mg daily Lexapro 10 mg daily Buspirone 7.5 mg 2 times daily  Patient reports no changes in her anxiety level but denies experiencing any adverse side effects from her medications.  Patient rates her anxiety a 7 or 8 out of 10.  Patient reports feeling overwhelmed due to life stressors, job, friends, relationships, bills, and current debt.  Patient endorses the following depressive symptoms: decreased energy, lack of motivation,  self isolation, and feelings of sadness.  In regards to her Adderall medication, she states that her prescription usually lasts throughout the day but she tends to crash towards 3 or 4 in the afternoon.  A PHQ-9 screen was performed with the patient scoring a 15.  A GAD-7 screen was also performed with the patient scoring a 13.  Patient is alert and oriented x4, calm, cooperative, and fully engaged in conversation during the encounter.  Patient states that she feels somewhat neutral and sad.  Patient denies suicidal or homicidal ideations.  She further denies auditory or visual hallucinations and does not appear to be responding to internal/external stimuli.  Patient endorses fair sleep and receives on average 4 to 5 hours of sleep each night.  Patient endorses fair appetite and eats on average 1-2 meals per day.  Patient denies alcohol consumption.  Patient denies tobacco use but states that she does engage in vaping regularly.  Patient endorses illicit drug use in the form of marijuana.  Visit Diagnosis:    ICD-10-CM   1. ADHD, predominantly inattentive type  F90.0 amphetamine-dextroamphetamine (ADDERALL XR) 30 MG 24 hr capsule    amphetamine-dextroamphetamine (ADDERALL XR) 30 MG 24 hr capsule    2. Generalized anxiety disorder  F41.1 busPIRone (BUSPAR) 15 MG tablet    escitalopram (LEXAPRO) 20 MG tablet    3. MDD (major depressive disorder), recurrent, in full remission (HCC)  F33.42 escitalopram (LEXAPRO) 20 MG tablet      Past Psychiatric History:  Generalized anxiety disorder ADHD, predominantly inattentive type Major depressive disorder  Past Medical History: History reviewed. No pertinent past medical history. History reviewed. No pertinent surgical history.  Family Psychiatric History:  Undiagnosed history of mood disorder, ADHD  Family History: History reviewed. No pertinent family history.  Social History:  Social History   Socioeconomic History   Marital status: Single     Spouse name: Not on file   Number of children: Not on file   Years of education: Not on file   Highest education level: Not on file  Occupational History   Not on file  Tobacco Use   Smoking status: Heavy Smoker    Types: E-cigarettes   Smokeless tobacco: Never  Substance and Sexual Activity   Alcohol use: Yes    Comment: social    Drug use: Yes    Types: Marijuana    Comment: joint a day    Sexual activity: Never  Other Topics Concern   Not on file  Social History Narrative   Not on file   Social Determinants of Health   Financial Resource Strain: Not on file  Food Insecurity: Not on file  Transportation Needs: Not on file  Physical Activity: Not on file  Stress: Not on file  Social Connections: Not on file    Allergies: No Known Allergies  Metabolic Disorder Labs: No results found for: HGBA1C, MPG No results found for: PROLACTIN No results found for: CHOL, TRIG, HDL, CHOLHDL, VLDL, LDLCALC No results found for: TSH  Therapeutic Level Labs: No results found for: LITHIUM No results found for: VALPROATE No components found for:  CBMZ  Current Medications: Current Outpatient Medications  Medication Sig Dispense Refill   [START ON 02/03/2021] amphetamine-dextroamphetamine (ADDERALL XR) 30 MG 24 hr capsule Take 1 capsule (30 mg total) by mouth daily. 30 capsule 0   [START ON 01/04/2021] amphetamine-dextroamphetamine (ADDERALL XR) 30 MG 24 hr capsule Take 1 capsule (30 mg total) by mouth in the morning. 30 capsule 0   busPIRone (BUSPAR) 15 MG tablet Take 1 tablet (15 mg total) by mouth 2 (two) times daily. 60 tablet 2   escitalopram (LEXAPRO) 20 MG tablet Take 1 tablet (20 mg total) by mouth daily. 30 tablet 2   HYDROcodone-acetaminophen (NORCO/VICODIN) 5-325 MG tablet Take 1-2 tablets by mouth every 6 (six) hours as needed. 6 tablet 0   ondansetron (ZOFRAN ODT) 4 MG disintegrating tablet Take 1 tablet (4 mg total) by mouth every 8 (eight) hours as needed for nausea or  vomiting. 6 tablet 0   No current facility-administered medications for this visit.     Musculoskeletal: Strength & Muscle Tone: Unable to assess due to telemedicine visit Gait & Station: Unable to assess due to telemedicine visit Patient leans: Unable to assess due to telemedicine visit  Psychiatric Specialty Exam: Review of Systems  Psychiatric/Behavioral:  Positive for decreased concentration and sleep disturbance. Negative for dysphoric mood, hallucinations, self-injury and suicidal ideas. The patient is nervous/anxious. The patient is not hyperactive.    There were no vitals taken for this visit.There is no height or weight on file to calculate BMI.  General Appearance: Well Groomed  Eye Contact:  Good  Speech:  Clear and Coherent and Normal Rate  Volume:  Normal  Mood:  Anxious and Depressed  Affect:  Congruent  Thought Process:  Coherent, Goal Directed, and Descriptions of Associations: Intact  Orientation:  Full (Time, Place, and Person)  Thought Content: Logical   Suicidal Thoughts:  No  Homicidal Thoughts:  No  Memory:  Immediate;   Good Recent;   Good Remote;   Good  Judgement:  Good  Insight:  Good  Psychomotor  Activity:  Normal  Concentration:  Concentration: Good and Attention Span: Good  Recall:  Good  Fund of Knowledge: Good  Language: Good  Akathisia:  NA  Handed:  Right  AIMS (if indicated): not done  Assets:  Communication Skills Desire for Improvement Financial Resources/Insurance Housing Social Support  ADL's:  Intact  Cognition: WNL  Sleep:  Fair   Screenings: AUDIT    Advertising copywriter from 11/09/2019 in [facility]  Alcohol Use Disorder Identification Test Final Score (AUDIT) 3      GAD-7    Flowsheet Row Video Visit from 12/14/2020 in [facility] Video Visit from 09/16/2020 in [facility]  Total GAD-7 Score 13 13      PHQ2-9    Flowsheet  Row Video Visit from 12/14/2020 in [id number] Video Visit from 09/16/2020 in [facility] Counselor from 11/09/2019 in [facility]  PHQ-2 Total Score 3 2 2   PHQ-9 Total Score 15 13 12       Flowsheet Row Video Visit from 12/14/2020 in [facility] Video Visit from 09/16/2020 in [facility] ED from 03/11/2020 in [facility]  C-SSRS RISK CATEGORY No Risk No Risk No Risk        Assessment and Plan:   Madison Kenney. Crear "[birth year]/11/2020" is a 30 year old female with a past psychiatric history significant for ADHD (inattentive type), generalized anxiety disorder, and major depressive disorder who presents to [facility] via virtual video visit for follow-up and medication management.  Patient reports that she still has not experienced much change in her anxiety since last dosage adjustments.  Patient continues to endorse anxiety and depressive symptoms attributed to multiple stressors in her life.  Patient was recommended increasing her dosage of buspirone from 10mg  to 15 mg 2 times daily for the management of her anxiety.  Patient was also recommended increasing her dosage of Lexapro from 10mg  to 20 mg daily for the management of her depressive symptoms and anxiety.  Patient was agreeable to recommendations.  Patient's medications to be e-prescribed to pharmacy of choice.  1. ADHD, predominantly inattentive type  - amphetamine-dextroamphetamine (ADDERALL XR) 30 MG 24 hr capsule; Take 1 capsule (30 mg total) by mouth in the morning.  Dispense: 30 capsule; Refill: 0 - amphetamine-dextroamphetamine (ADDERALL XR) 30 MG 24 hr capsule; Take 1 capsule (30 mg total) by mouth daily.  Dispense: 30 capsule; Refill: 0  2. Generalized anxiety disorder  - busPIRone (BUSPAR) 15 MG tablet; Take 1 tablet (15 mg total) by mouth  2 (two) times daily.  Dispense: 60 tablet; Refill: 2 - escitalopram (LEXAPRO) 20 MG tablet; Take 1 tablet (20 mg total) by mouth daily.  Dispense: 30 tablet; Refill: 2  3. MDD (major depressive disorder), recurrent, in full remission (HCC)  - escitalopram (LEXAPRO) 20 MG tablet; Take 1 tablet (20 mg total) by mouth daily.  Dispense: 30 tablet; Refill: 2  Patient to follow up in 2 months Provider spent a total of 24 minutes with the patient/reviewing the patient's chart  26, PA 12/14/2020, 11:39 PM

## 2021-01-12 ENCOUNTER — Telehealth (HOSPITAL_COMMUNITY): Payer: Self-pay | Admitting: Physician Assistant

## 2021-01-12 ENCOUNTER — Other Ambulatory Visit (HOSPITAL_COMMUNITY): Payer: Self-pay | Admitting: Physician Assistant

## 2021-01-12 ENCOUNTER — Telehealth (HOSPITAL_COMMUNITY): Payer: Self-pay | Admitting: *Deleted

## 2021-01-12 DIAGNOSIS — F900 Attention-deficit hyperactivity disorder, predominantly inattentive type: Secondary | ICD-10-CM

## 2021-01-12 MED ORDER — AMPHETAMINE-DEXTROAMPHET ER 15 MG PO CP24: 30.0000 mg | ORAL_CAPSULE | Freq: Every day | ORAL | 0 refills | Status: DC

## 2021-01-12 MED ORDER — AMPHETAMINE-DEXTROAMPHET ER 15 MG PO CP24
30.0000 mg | ORAL_CAPSULE | Freq: Every morning | ORAL | 0 refills | Status: DC
Start: 2021-01-12 — End: 2021-04-12

## 2021-01-12 NOTE — Telephone Encounter (Signed)
Pt called stating [place] pharmacy has been out of Adderall 30 mg. Madison Chavez will contact provider to request 15 mg, take 2 by mouth.

## 2021-01-12 NOTE — Telephone Encounter (Signed)
Provider was contacted by Orpah Clinton. Reola Calkins, RN regarding unavailability of Adderall prescription. Provider to rewrite patient's prescription for Adderall XR 15 mg 2 times daily.

## 2021-01-12 NOTE — Progress Notes (Signed)
Provider was contacted by Suzanne K. Beck, RN regarding unavailability of Adderall prescription. Provider to rewrite patient's prescription for Adderall XR 15 mg 2 times daily.

## 2021-01-12 NOTE — Telephone Encounter (Signed)
Message acknowledged and reviewed. Revision for patient's prescriptions were made and sent to pharmacy of choice.

## 2021-01-12 NOTE — Telephone Encounter (Signed)
Call from [place] at [facility] stating she has held two rxs for patients adderall from 01/04/21 and 02/03/21 for a shipment of the 30 mg pills to arrive at the pharmacy but they have been told they have been discontinued. They have on hand 15 mg Adderall XR and would like Eddie PA to excribe rxs for the 15's to replace the rx on hold for the 30's. Once they have the new rxs they will destroy the 30s. Will forward this request to [facility].

## 2021-02-15 ENCOUNTER — Telehealth (INDEPENDENT_AMBULATORY_CARE_PROVIDER_SITE_OTHER): Payer: 59 | Admitting: Physician Assistant

## 2021-02-15 ENCOUNTER — Encounter (HOSPITAL_COMMUNITY): Payer: Self-pay | Admitting: Physician Assistant

## 2021-02-15 DIAGNOSIS — F411 Generalized anxiety disorder: Secondary | ICD-10-CM | POA: Diagnosis not present

## 2021-02-15 DIAGNOSIS — F900 Attention-deficit hyperactivity disorder, predominantly inattentive type: Secondary | ICD-10-CM

## 2021-02-15 DIAGNOSIS — F3342 Major depressive disorder, recurrent, in full remission: Secondary | ICD-10-CM | POA: Diagnosis not present

## 2021-02-15 MED ORDER — ESCITALOPRAM OXALATE 20 MG PO TABS: 20.0000 mg | ORAL_TABLET | Freq: Every day | ORAL | 2 refills | Status: DC

## 2021-02-15 MED ORDER — BUSPIRONE HCL 15 MG PO TABS: 15.0000 mg | ORAL_TABLET | Freq: Two times a day (BID) | ORAL | 2 refills | Status: DC

## 2021-02-15 NOTE — Progress Notes (Signed)
BH MD/PA/NP OP Progress Note  Virtual Visit via Video Note  I connected with Madison Chavez on 02/15/21 at  2:00 PM EST by a video enabled telemedicine application and verified that I am speaking with the correct person using two identifiers.  Location: Patient: Home Provider: Clinic   I discussed the limitations of evaluation and management by telemedicine and the availability of in person appointments. The patient expressed understanding and agreed to proceed.  Follow Up Instructions:  I discussed the assessment and treatment plan with the patient. The patient was provided an opportunity to ask questions and all were answered. The patient agreed with the plan and demonstrated an understanding of the instructions.   The patient was advised to call back or seek an in-person evaluation if the symptoms worsen or if the condition fails to improve as anticipated.  I provided 13 minutes of non-face-to-face time during this encounter.  Madison Mood, PA    02/15/2021 2:32 PM Madison Chavez  MRN:  [id number]  Chief Complaint: Follow up and medication management  HPI:   Madison Chavez "Madison Chavez" is a 31 year old female with a past psychiatric history significant for attention deficit hyperactivity disorder (inattentive type), generalized anxiety disorder, and major depressive disorder who presents to [facility] via virtual video visit for follow-up and medication management.  Patient is currently being managed on the following medications:  Adderall XR 30 mg daily Lexapro 20 mg daily Buspirone 15 mg 2 times daily  Patient reports that they have noticed improvement in their anxiety since their last medication adjustments.  She also reports experiencing a rough patch over the last couple of months due to multiple changes in her life.  Patient reports that these changes in her life have overwhelmed and exhausted her.  Although patient is going  through some stressors, she states that she felt great waking up this morning.  Patient reports that she still continues to have issues with her concentration/attention.  The only benefit that she believes she has experienced from her Adderall is more even with getting out of bed as well as increased energy.  Patient describes herself as still being all over the place in terms of being able to concentrate.  Patient has not used any other ADHD medications in the past and pick up her last refill this past Monday.  A PHQ-9 screen was performed with the patient scoring a 13.  A GAD-7 screen was also performed with the patient scoring an 11.  Patient is alert and oriented x4, pleasant, calm, cooperative, and fully engaged in conversation during the encounter.  Patient endorses good Chavez.  Patient denies suicidal or homicidal ideations.  They further deny auditory or visual hallucinations and do not appear to be responding to internal/external stimuli.  Patient endorses fair sleep and receives on average 4 to 5 hours each night.  Patient endorses good appetite and eats on average 2 meals per day.  Patient has a past history of binge eating but states that she has not witnessed this eating behavior in a while.  Patient denies alcohol consumption.  She denies tobacco use but states that she does engage in [place].  Patient endorses illicit drug use in the form of marijuana occasionally.  Visit Diagnosis:    ICD-10-CM   1. Generalized anxiety disorder  F41.1 busPIRone (BUSPAR) 15 MG tablet    escitalopram (LEXAPRO) 20 MG tablet    2. MDD (major depressive disorder), recurrent, in full remission ([place])  F33.42 escitalopram (LEXAPRO) 20 MG tablet      Past Psychiatric History:  Generalized anxiety disorder ADHD, predominantly inattentive type Major depressive disorder  Past Medical History: History reviewed. No pertinent past medical history. History reviewed. No pertinent surgical history.  Family  Psychiatric History:  Undiagnosed history of Chavez disorder, ADHD  Family History: History reviewed. No pertinent family history.  Social History:  Social History   Socioeconomic History   Marital status: Single    Spouse name: Not on file   Number of children: Not on file   Years of education: Not on file   Highest education level: Not on file  Occupational History   Not on file  Tobacco Use   Smoking status: Heavy Smoker    Types: E-cigarettes   Smokeless tobacco: Never  Substance and Sexual Activity   Alcohol use: Yes    Comment: social    Drug use: Yes    Types: Marijuana    Comment: joint a day    Sexual activity: Never  Other Topics Concern   Not on file  Social History Narrative   Not on file   Social Determinants of Health   Financial Resource Strain: Not on file  Food Insecurity: Not on file  Transportation Needs: Not on file  Physical Activity: Not on file  Stress: Not on file  Social Connections: Not on file    Allergies: No Known Allergies  Metabolic Disorder Labs: No results found for: HGBA1C, MPG No results found for: PROLACTIN No results found for: CHOL, TRIG, HDL, CHOLHDL, VLDL, LDLCALC No results found for: TSH  Therapeutic Level Labs: No results found for: LITHIUM No results found for: VALPROATE No components found for:  CBMZ  Current Medications: Current Outpatient Medications  Medication Sig Dispense Refill   amphetamine-dextroamphetamine (ADDERALL XR) 15 MG 24 hr capsule Take 2 capsules by mouth in the morning. 60 capsule 0   amphetamine-dextroamphetamine (ADDERALL XR) 15 MG 24 hr capsule Take 2 capsules by mouth daily. 60 capsule 0   busPIRone (BUSPAR) 15 MG tablet Take 1 tablet (15 mg total) by mouth 2 (two) times daily. 60 tablet 2   escitalopram (LEXAPRO) 20 MG tablet Take 1 tablet (20 mg total) by mouth daily. 30 tablet 2   HYDROcodone-acetaminophen (NORCO/VICODIN) 5-325 MG tablet Take 1-2 tablets by mouth every 6 (six) hours as  needed. 6 tablet 0   ondansetron (ZOFRAN ODT) 4 MG disintegrating tablet Take 1 tablet (4 mg total) by mouth every 8 (eight) hours as needed for nausea or vomiting. 6 tablet 0   No current facility-administered medications for this visit.     Musculoskeletal: Strength & Muscle Tone: within normal limits Gait & Station: normal Patient leans: N/A  Psychiatric Specialty Exam: Review of Systems  Psychiatric/Behavioral:  Positive for decreased concentration and sleep disturbance. Negative for dysphoric Chavez, hallucinations, self-injury and suicidal ideas. The patient is nervous/anxious. The patient is not hyperactive.    There were no vitals taken for this visit.There is no height or weight on file to calculate BMI.  General Appearance: Casual  Eye Contact:  Good  Speech:  Clear and Coherent and Normal Rate  Volume:  Normal  Chavez:  Anxious and Euthymic  Affect:  Appropriate  Thought Process:  Coherent and Descriptions of Associations: Intact  Orientation:  Full (Time, Place, and Person)  Thought Content: WDL   Suicidal Thoughts:  No  Homicidal Thoughts:  No  Memory:  Immediate;   Good Recent;   Good Remote;  Good  Judgement:  Good  Insight:  Good  Psychomotor Activity:  Normal  Concentration:  Concentration: Good and Attention Span: Good  Recall:  Good  Fund of Knowledge: Good  Language: Good  Akathisia:  NA  Handed:  Right  AIMS (if indicated): not done  Assets:  Communication Skills Desire for Improvement Financial Resources/Insurance Housing Social Support  ADL's:  Intact  Cognition: WNL  Sleep:  Fair   Screenings: AUDIT    Health and safety inspector from 11/09/2019 in [facility]  Alcohol Use Disorder Identification Test Final Score (AUDIT) 3      GAD-7    Flowsheet Row Video Visit from 02/15/2021 in [facility] Video Visit from 12/14/2020 in [facility] Video Visit from 09/16/2020  in [facility]  Total GAD-7 Score 11 13 13       PHQ2-9    Flowsheet Row Video Visit from 02/15/2021 in [facility] Video Visit from 12/14/2020 in [facility] Video Visit from 09/16/2020 in [facility] Counselor from 11/09/2019 in [facility]  PHQ-2 Total Score 2 3 2 2   PHQ-9 Total Score 13 15 13 12       Flowsheet Row Video Visit from 02/15/2021 in [facility] Video Visit from 12/14/2020 in [facility] Video Visit from 09/16/2020 in [place] No Risk No Risk No Risk        Assessment and Plan:   Zorica Hickenbottom. [place] "Madison Chavez" is a 31 year old female with a past psychiatric history significant for attention deficit hyperactivity disorder (inattentive type), generalized anxiety disorder, and major depressive disorder who presents to [facility] via virtual video visit for follow-up and medication management.  Despite experiencing some stressors in her life, patient appears to be in a good Chavez.  Patient's main concern involves her attention/concentration.  Patient has not found any major benefit from using Adderall.  Patient is interested in being placed on another medication for the management of her ADHD symptoms.  Patient picked up her last prescription of Adderall last Monday.  Provider recommended patient to finish up her last prescription of Adderall before being placed on a substitute prescription.  Provider to reach out to patient to discuss other alternatives in the management of her attention/concentration.  Patient's medications to be e-prescribed to pharmacy of choice.  1. Generalized anxiety disorder  - busPIRone (BUSPAR) 15 MG tablet; Take 1 tablet (15 mg total) by mouth 2 (two) times daily.  Dispense: 60  tablet; Refill: 2 - escitalopram (LEXAPRO) 20 MG tablet; Take 1 tablet (20 mg total) by mouth daily.  Dispense: 30 tablet; Refill: 2  2. MDD (major depressive disorder), recurrent, in full remission ([place])  - escitalopram (LEXAPRO) 20 MG tablet; Take 1 tablet (20 mg total) by mouth daily.  Dispense: 30 tablet; Refill: 2  3. ADHD, predominantly inattentive type Patient to continue taking last prescription of Adderall XR 30 mg daily  Patient to follow up in 2 months Provider spent a total of 13 minutes with the patient/reviewing patient's chart  Madison Mood, PA 02/15/2021, 2:32 PM

## 2021-04-12 ENCOUNTER — Encounter (HOSPITAL_COMMUNITY): Payer: Self-pay | Admitting: Physician Assistant

## 2021-04-12 ENCOUNTER — Telehealth (INDEPENDENT_AMBULATORY_CARE_PROVIDER_SITE_OTHER): Payer: 59 | Admitting: Physician Assistant

## 2021-04-12 DIAGNOSIS — F411 Generalized anxiety disorder: Secondary | ICD-10-CM | POA: Diagnosis not present

## 2021-04-12 DIAGNOSIS — F900 Attention-deficit hyperactivity disorder, predominantly inattentive type: Secondary | ICD-10-CM | POA: Diagnosis not present

## 2021-04-12 DIAGNOSIS — F3342 Major depressive disorder, recurrent, in full remission: Secondary | ICD-10-CM

## 2021-04-12 MED ORDER — LISDEXAMFETAMINE DIMESYLATE 30 MG PO CAPS: 30.0000 mg | ORAL_CAPSULE | Freq: Every day | ORAL | 0 refills | Status: DC

## 2021-04-12 MED ORDER — BUSPIRONE HCL 15 MG PO TABS: 15.0000 mg | ORAL_TABLET | Freq: Two times a day (BID) | ORAL | 2 refills | Status: DC

## 2021-04-12 MED ORDER — ESCITALOPRAM OXALATE 20 MG PO TABS: 20.0000 mg | ORAL_TABLET | Freq: Every day | ORAL | 2 refills | Status: DC

## 2021-04-12 NOTE — Progress Notes (Addendum)
BH MD/PA/NP OP Progress Note ? ?Virtual Visit via Video Note ? ?I connected with Madison Chavez on 04/12/21 at  2:00 PM EDT by a video enabled telemedicine application and verified that I am speaking with the correct person using two identifiers. ? ?Location: ?Patient: Home ?Provider: Clinic ?  ?I discussed the limitations of evaluation and management by telemedicine and the availability of in person appointments. The patient expressed understanding and agreed to proceed. ? ?Follow Up Instructions: ? ?I discussed the assessment and treatment plan with the patient. The patient was provided an opportunity to ask questions and all were answered. The patient agreed with the plan and demonstrated an understanding of the instructions. ?  ?The patient was advised to call back or seek an in-person evaluation if the symptoms worsen or if the condition fails to improve as anticipated. ? ?I provided 12 minutes of non-face-to-face time during this encounter. ? ?Meta HatchetUchenna E Trask Vosler, PA ? ? ?04/12/2021 9:57 PM ?Madison Chavez  ?MRN:  [id number] ? ?Chief Complaint:  ?Chief Complaint  ?Patient presents with  ? Follow-up  ? Medication Management  ? ?HPI:  ? 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  Patient is currently being managed on the following medications: ? ?Escitalopram 20 mg daily ?Buspirone 15 mg 2 times daily ? ?Patient continues to express that her attention and concentration are still all over the place.  Patient is no longer taking Adderall and states that nothing has changed in terms of her attention/concentration.  She does express that she has a little less energy in the morning.  Patient denies  experiencing depressive symptoms stating that things have been pretty good lately.  Patient continues to endorse some anxiety and rates her anxiety at 5 out of 10.  Patient denies any new stressors at this time.  A PHQ-9 screen was performed with the patient scoring an 11.  A GAD-7 screen was also performed with the patient scoring a 9. ? ?Patient is alert and oriented x4, calm, cooperative, and fully engaged in conversation during the encounter.  Patient endorses good mood.  Patient denies suicidal or homicidal ideations.  She further denies auditory or visual hallucinations and does not appear to be responding to internal/external stimuli.  Patient endorses fair sleep and receives on average 5 to 6 hours of sleep each night.  Patient endorses some binge eating since discontinuing Adderall.  Patient eats on average 2-3 meals per day.  Patient denies alcohol consumption.  Patient denies tobacco use but does engage in vaping.  Patient endorses illicit drug use in the form of marijuana. ? ?Visit Diagnosis:  ?  ICD-10-CM   ?1. ADHD, predominantly inattentive type  F90.0 lisdexamfetamine (VYVANSE) 30 MG capsule  ?  ?2. Generalized anxiety disorder  F41.1 escitalopram (LEXAPRO) 20 MG tablet  ?  busPIRone (BUSPAR) 15 MG tablet  ?  ?3. MDD (major depressive disorder), recurrent, in full remission (HCC)  F33.42 escitalopram (LEXAPRO) 20 MG tablet  ?  ? ? ?Past Psychiatric History:  ?Generalized anxiety disorder ?ADHD, predominantly inattentive type ?Major depressive disorder ? ?Past Medical History: History reviewed. No pertinent past medical history. History reviewed. No pertinent surgical history. ? ?Family Psychiatric History:  ?Undiagnosed history of mood disorder, ADHD ? ?Family History: History reviewed. No  pertinent family history. ? ?Social History:  ?Social History  ? ?Socioeconomic History  ? Marital status: Single  ?  Spouse name: Not on file  ? Number of children: Not on file  ? Years of education: Not on file   ? Highest education level: Not on file  ?Occupational History  ? Not on file  ?Tobacco Use  ? Smoking status: Heavy Smoker  ?  Types: E-cigarettes  ? Smokeless tobacco: Never  ?Substance and Sexual Activity  ? Alcohol use: Yes  ?  Comment: social   ? Drug use: Yes  ?  Types: Marijuana  ?  Comment: joint a day   ? Sexual activity: Never  ?Other Topics Concern  ? Not on file  ?Social History Narrative  ? Not on file  ? ?Social Determinants of Health  ? ?Financial Resource Strain: Not on file  ?Food Insecurity: Not on file  ?Transportation Needs: Not on file  ?Physical Activity: Not on file  ?Stress: Not on file  ?Social Connections: Not on file  ? ? ?Allergies: No Known Allergies ? ?Metabolic Disorder Labs: ?No results found for: HGBA1C, MPG ?No results found for: PROLACTIN ?No results found for: CHOL, TRIG, HDL, CHOLHDL, VLDL, LDLCALC ?No results found for: TSH ? ?Therapeutic Level Labs: ?No results found for: LITHIUM ?No results found for: VALPROATE ?No components found for:  CBMZ ? ?Current Medications: ?Current Outpatient Medications  ?Medication Sig Dispense Refill  ? lisdexamfetamine (VYVANSE) 30 MG capsule Take 1 capsule (30 mg total) by mouth daily. 30 capsule 0  ? busPIRone (BUSPAR) 15 MG tablet Take 1 tablet (15 mg total) by mouth 2 (two) times daily. 60 tablet 2  ? escitalopram (LEXAPRO) 20 MG tablet Take 1 tablet (20 mg total) by mouth daily. 30 tablet 2  ? HYDROcodone-acetaminophen (NORCO/VICODIN) 5-325 MG tablet Take 1-2 tablets by mouth every 6 (six) hours as needed. 6 tablet 0  ? ondansetron (ZOFRAN ODT) 4 MG disintegrating tablet Take 1 tablet (4 mg total) by mouth every 8 (eight) hours as needed for nausea or vomiting. 6 tablet 0  ? ?No current facility-administered medications for this visit.  ? ? ? ?Musculoskeletal: ?Strength & Muscle Tone: within normal limits ?Gait & Station: normal ?Patient leans: N/A ? ?Psychiatric Specialty Exam: ?Review of Systems  ?Psychiatric/Behavioral:  Positive for  decreased concentration and sleep disturbance. Negative for dysphoric mood, hallucinations, self-injury and suicidal ideas. The patient is nervous/anxious. The patient is not hyperactive.    ?There were no vitals taken for this visit.There is no height or weight on file to calculate BMI.  ?General Appearance: Casual  ?Eye Contact:  Good  ?Speech:  Clear and Coherent and Normal Rate  ?Volume:  Normal  ?Mood:  Anxious and Euthymic  ?Affect:  Appropriate and Congruent  ?Thought Process:  Coherent, Goal Directed, and Descriptions of Associations: Intact  ?Orientation:  Full (Time, Place, and Person)  ?Thought Content: WDL   ?Suicidal Thoughts:  No  ?Homicidal Thoughts:  No  ?Memory:  Immediate;   Good ?Recent;   Good ?Remote;   Good  ?Judgement:  Good  ?Insight:  Good  ?Psychomotor Activity:  Normal  ?Concentration:  Concentration: Good and Attention Span: Good  ?Recall:  Good  ?Fund of Knowledge: Good  ?Language: Good  ?Akathisia:  No  ?Handed:  Right  ?AIMS (if indicated): not done  ?Assets:  Communication Skills ?Desire for Improvement ?Financial Resources/Insurance ?Housing ?Social Support  ?ADL's:  Intact  ?Cognition: WNL  ?Sleep:  Fair  ? ?  Screenings: ?AUDIT   ? ?Flowsheet Row Counselor from 11/09/2019 in [facility]  ?Alcohol Use Disorder Identification Test Final Score (AUDIT) 3  ? ?  ? ?GAD-7   ? ?Flowsheet Row Video Visit from 04/12/2021 in [facility] Video Visit from 02/15/2021 in [facility] Video Visit from 12/14/2020 in [facility] Video Visit from 09/16/2020 in [facility]  ?Total GAD-7 Score 9 11 13 13   ? ?  ? ?PHQ2-9   ? ?Flowsheet Row Video Visit from 04/12/2021 in [facility] Video Visit from 02/15/2021 in [facility] Video Visit from 12/14/2020 in [facility] Video Visit from 09/16/2020  in [facility] Counselor from 11/09/2019 in [facility]  ?PHQ-2 Total Score 2 2 3 2 2   ?PHQ-9 Total Score 11 13 15 13 12   ? ?  ? ?Flowsheet Row Video Vi

## 2021-05-09 ENCOUNTER — Telehealth (HOSPITAL_COMMUNITY): Payer: Self-pay | Admitting: *Deleted

## 2021-05-09 ENCOUNTER — Telehealth (HOSPITAL_COMMUNITY): Payer: Self-pay | Admitting: Physician Assistant

## 2021-05-09 NOTE — Telephone Encounter (Signed)
Patient needs refill on vyvanse. Will take last pill on 5/4 ? ? ?

## 2021-05-09 NOTE — Telephone Encounter (Signed)
Will forward request to [facility] for patients Vyvanse to be sent in to her preferred pharmacy. She should be out this week and has a future appt with her provider on 05/24/21. ?

## 2021-05-10 ENCOUNTER — Other Ambulatory Visit (HOSPITAL_COMMUNITY): Payer: Self-pay | Admitting: Physician Assistant

## 2021-05-10 DIAGNOSIS — F900 Attention-deficit hyperactivity disorder, predominantly inattentive type: Secondary | ICD-10-CM

## 2021-05-10 MED ORDER — LISDEXAMFETAMINE DIMESYLATE 30 MG PO CAPS: 30.0000 mg | ORAL_CAPSULE | Freq: Every day | ORAL | 0 refills | Status: DC

## 2021-05-10 NOTE — Progress Notes (Unsigned)
For theProvider was contacted by Cecille Amsterdam. Foster regarding refill request patient.  Patient's medication to be e-prescribed to pharmacy of choice. ?

## 2021-05-11 NOTE — Telephone Encounter (Signed)
Message acknowledged and reviewed.  Patient's medication has been filled and sent to pharmacy of choice.

## 2021-05-11 NOTE — Telephone Encounter (Signed)
Message acknowledged and reviewed. Patient's medication was e-prescribed to pharmacy of choice.

## 2021-05-24 ENCOUNTER — Telehealth (INDEPENDENT_AMBULATORY_CARE_PROVIDER_SITE_OTHER): Payer: 59 | Admitting: Physician Assistant

## 2021-05-24 DIAGNOSIS — F3342 Major depressive disorder, recurrent, in full remission: Secondary | ICD-10-CM | POA: Diagnosis not present

## 2021-05-24 DIAGNOSIS — F411 Generalized anxiety disorder: Secondary | ICD-10-CM

## 2021-05-24 DIAGNOSIS — F900 Attention-deficit hyperactivity disorder, predominantly inattentive type: Secondary | ICD-10-CM

## 2021-05-24 MED ORDER — LISDEXAMFETAMINE DIMESYLATE 30 MG PO CAPS: 30.0000 mg | ORAL_CAPSULE | Freq: Every day | ORAL | 0 refills | Status: DC

## 2021-05-24 MED ORDER — ESCITALOPRAM OXALATE 20 MG PO TABS: 20.0000 mg | ORAL_TABLET | Freq: Every day | ORAL | 2 refills | Status: DC

## 2021-05-24 MED ORDER — BUSPIRONE HCL 15 MG PO TABS: 15.0000 mg | ORAL_TABLET | Freq: Two times a day (BID) | ORAL | 2 refills | Status: DC

## 2021-05-24 NOTE — Progress Notes (Addendum)
BH MD/PA/NP OP Progress Note  Virtual Visit via Telephone Note  I connected with Madison Chavez on 05/25/21 at  4:30 PM EDT by telephone and verified that I am speaking with the correct person using two identifiers.  Location: Patient: Home Provider: Clinic   I discussed the limitations, risks, security and privacy concerns of performing an evaluation and management service by telephone and the availability of in person appointments. I also discussed with the patient that there may be a patient responsible charge related to this service. The patient expressed understanding and agreed to proceed.  Follow Up Instructions:  I discussed the assessment and treatment plan with the patient. The patient was provided an opportunity to ask questions and all were answered. The patient agreed with the plan and demonstrated an understanding of the instructions.   The patient was advised to call back or seek an in-person evaluation if the symptoms worsen or if the condition fails to improve as anticipated.  I provided 8 minutes of non-face-to-face time during this encounter.  Meta Hatchet, PA   05/25/2021 1:23 PM Madison Chavez  MRN:  [id number]  Chief Complaint:  No chief complaint on file.  HPI:   Madison Chavez. 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  Patient is currently being managed on the following medications:  Escitalopram 20 mg daily Buspirone 15 mg 2 times daily Lisdexamfetamine (Vyvanse) 30 mg daily  When first starting Vyvanse, patient described the experience as a roller coaster ride.  She reports that her experience with Vyvanse has been more leveled and that it has  been helping with her concentration.  She reports that the medication works the majority of the day.  Patient denies experiencing depressive symptoms.  She endorses anxiety but states that it is manageable at this time.  Patient rates her anxiety at a 5 or 6 out of 10.  Patient denies any new stressors at this time.  A GAD-7 screen was performed with the patient scoring a 5.  Patient is alert and oriented x4, pleasant, calm, cooperative, and fully engaged in conversation during the encounter.  Patient endorses good mood.  Patient denies suicidal or homicidal ideations.  She further denies auditory or visual hallucinations and does not appear to be responding to internal/external stimuli.  Patient endorses fair sleep and receives on average 6 hours of sleep each night.   Patient eats on average 2-3 meals per day.  Patient denies alcohol consumption.  Patient denies tobacco use but does engage in vaping.  Patient endorses illicit drug use in the form of marijuana.  Visit Diagnosis:    ICD-10-CM   1. ADHD, predominantly inattentive type  F90.0 lisdexamfetamine (VYVANSE) 30 MG capsule    2. Generalized anxiety disorder  F41.1 escitalopram (LEXAPRO) 20 MG tablet    busPIRone (BUSPAR) 15 MG tablet    3. MDD (major depressive disorder), recurrent, in full remission (HCC)  F33.42 escitalopram (LEXAPRO) 20 MG tablet      Past Psychiatric History:  Generalized anxiety disorder ADHD, predominantly inattentive type Major depressive disorder  Past Medical History: No past medical history on file. No past surgical history on file.  Family Psychiatric History:  Undiagnosed history of mood disorder, ADHD  Family History: No family history on file.  Social  History:  Social History   Socioeconomic History   Marital status: Single    Spouse name: Not on file   Number of children: Not on file   Years of education: Not on file   Highest education level: Not on file  Occupational History   Not on file   Tobacco Use   Smoking status: Heavy Smoker    Types: E-cigarettes   Smokeless tobacco: Never  Substance and Sexual Activity   Alcohol use: Yes    Comment: social    Drug use: Yes    Types: Marijuana    Comment: joint a day    Sexual activity: Never  Other Topics Concern   Not on file  Social History Narrative   Not on file   Social Determinants of Health   Financial Resource Strain: Not on file  Food Insecurity: Not on file  Transportation Needs: Not on file  Physical Activity: Not on file  Stress: Not on file  Social Connections: Not on file    Allergies: No Known Allergies  Metabolic Disorder Labs: No results found for: HGBA1C, MPG No results found for: PROLACTIN No results found for: CHOL, TRIG, HDL, CHOLHDL, VLDL, LDLCALC No results found for: TSH  Therapeutic Level Labs: No results found for: LITHIUM No results found for: VALPROATE No components found for:  CBMZ  Current Medications: Current Outpatient Medications  Medication Sig Dispense Refill   busPIRone (BUSPAR) 15 MG tablet Take 1 tablet (15 mg total) by mouth 2 (two) times daily. 60 tablet 2   escitalopram (LEXAPRO) 20 MG tablet Take 1 tablet (20 mg total) by mouth daily. 30 tablet 2   HYDROcodone-acetaminophen (NORCO/VICODIN) 5-325 MG tablet Take 1-2 tablets by mouth every 6 (six) hours as needed. 6 tablet 0   [START ON 06/09/2021] lisdexamfetamine (VYVANSE) 30 MG capsule Take 1 capsule (30 mg total) by mouth daily. 30 capsule 0   ondansetron (ZOFRAN ODT) 4 MG disintegrating tablet Take 1 tablet (4 mg total) by mouth every 8 (eight) hours as needed for nausea or vomiting. 6 tablet 0   No current facility-administered medications for this visit.     Musculoskeletal: Strength & Muscle Tone: within normal limits Gait & Station: normal Patient leans: N/A  Psychiatric Specialty Exam: Review of Systems  Psychiatric/Behavioral:  Positive for decreased concentration and sleep disturbance. Negative for  dysphoric mood, hallucinations, self-injury and suicidal ideas. The patient is nervous/anxious. The patient is not hyperactive.    There were no vitals taken for this visit.There is no height or weight on file to calculate BMI.  General Appearance: Casual  Eye Contact:  Good  Speech:  Clear and Coherent and Normal Rate  Volume:  Normal  Mood:  Anxious and Euthymic  Affect:  Appropriate and Congruent  Thought Process:  Coherent, Goal Directed, and Descriptions of Associations: Intact  Orientation:  Full (Time, Place, and Person)  Thought Content: WDL   Suicidal Thoughts:  No  Homicidal Thoughts:  No  Memory:  Immediate;   Good Recent;   Good Remote;   Good  Judgement:  Good  Insight:  Good  Psychomotor Activity:  Normal  Concentration:  Concentration: Good and Attention Span: Good  Recall:  Good  Fund of Knowledge: Good  Language: Good  Akathisia:  No  Handed:  Right  AIMS (if indicated): not done  Assets:  Communication Skills Desire for Improvement Financial Resources/Insurance Housing Social Support  ADL's:  Intact  Cognition: WNL  Sleep:  Fair   Screenings: AUDIT  Flowsheet Row [facility]  Alcohol Use Disorder Identification Test Final Score (AUDIT) 3      GAD-7    Flowsheet Row [facility]  Total GAD-7 Score 5 9 11 13 13       PHQ2-9    Flowsheet Row [facility] from [facility]20[facility] from [facility]20[facility]  [facility]  PHQ-2 Total Score 1 2 2 3 2   PHQ-9 Total Score -- 11 13 15 13       Flowsheet Row [facility]  C-SSRS RISK CATEGORY No Risk No Risk No Risk        Assessment and Plan:   Griffin Basilola M. 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  Patient reports that her use of Vyvanse has been helpful in the management of her attention/concentration.  Patient denies experiencing depressive symptoms but does still endorse some anxiety.  Patient denies the need for dosage adjustments at this time.  Patient's medications to be e-prescribed to pharmacy of choice.  Collaboration of Care: Collaboration of Care: Medication Management AEB provider managing patient's psychiatric medications and Psychiatrist AEB patient being followed by mental health provider  Patient/Guardian was advised Release of Information must be obtained prior to any record release in order to collaborate their care with an outside provider. Patient/Guardian was advised if they have not already done so to contact the registration department to sign all necessary forms in order for us to release information regarding their care.   Consent: Patient/Guardian gives verbal consent for treatment and assignment of benefits for services provided during this visit. Patient/Guardian expressed understanding and agreed to proceed.   1.  ADHD, predominantly inattentive type  - lisdexamfetamine (VYVANSE) 30 MG capsule; Take 1 capsule (30 mg total) by mouth daily.  Dispense: 30 capsule; Refill: 0  2. Generalized anxiety disorder  - escitalopram (LEXAPRO) 20 MG tablet; Take 1 tablet (20 mg total) by mouth daily.  Dispense: 30 tablet; Refill: 2 - busPIRone (BUSPAR) 15 MG tablet; Take 1 tablet (15 mg total) by mouth 2 (two) times daily.  Dispense: 60 tablet; Refill: 2  3. MDD (major depressive disorder), recurrent, in full remission (HCC)  - escitalopram (LEXAPRO) 20 MG tablet; Take 1 tablet (20 mg total) by mouth daily.  Dispense: 30 tablet; Refill: 2  Patient to follow-up in 2 months Provider spent a total of 8 minutes with the patient/reviewing patient's chart  Meta HatchetUchenna E Tanish Sinkler, PA 05/25/2021, 1:23 PM

## 2021-05-27 ENCOUNTER — Encounter (HOSPITAL_COMMUNITY): Payer: Self-pay | Admitting: Physician Assistant

## 2021-07-17 ENCOUNTER — Telehealth (HOSPITAL_COMMUNITY): Payer: Self-pay | Admitting: *Deleted

## 2021-07-17 ENCOUNTER — Other Ambulatory Visit (HOSPITAL_COMMUNITY): Payer: Self-pay | Admitting: Psychiatry

## 2021-07-17 DIAGNOSIS — F900 Attention-deficit hyperactivity disorder, predominantly inattentive type: Secondary | ICD-10-CM

## 2021-07-17 MED ORDER — LISDEXAMFETAMINE DIMESYLATE 30 MG PO CAPS: 30.0000 mg | ORAL_CAPSULE | Freq: Every day | ORAL | 0 refills | Status: DC

## 2021-07-17 NOTE — Telephone Encounter (Signed)
Madison Chavez is Out of Office Sending Refill Request to Dr B.Parsons  lisdexamfetamine (VYVANSE) 30 MG capsule Take 1 capsule (30 mg total) by mouth daily

## 2021-07-17 NOTE — Telephone Encounter (Signed)
Medication refilled and sent to preferred pharmacy

## 2021-07-25 ENCOUNTER — Encounter (HOSPITAL_COMMUNITY): Payer: Self-pay

## 2021-07-26 ENCOUNTER — Telehealth (HOSPITAL_COMMUNITY): Payer: 59 | Admitting: Physician Assistant

## 2021-08-21 ENCOUNTER — Telehealth (HOSPITAL_COMMUNITY): Payer: Self-pay | Admitting: *Deleted

## 2021-08-21 NOTE — Telephone Encounter (Signed)
7/19 appt cancelled to be reschedule Patient request refill lisdexamfetamine (VYVANSE) 30 MG capsule

## 2021-08-22 ENCOUNTER — Other Ambulatory Visit (HOSPITAL_COMMUNITY): Payer: Self-pay | Admitting: Physician Assistant

## 2021-08-22 DIAGNOSIS — F900 Attention-deficit hyperactivity disorder, predominantly inattentive type: Secondary | ICD-10-CM

## 2021-08-22 MED ORDER — LISDEXAMFETAMINE DIMESYLATE 30 MG PO CAPS: 30.0000 mg | ORAL_CAPSULE | Freq: Every day | ORAL | 0 refills | Status: DC

## 2021-08-22 NOTE — Telephone Encounter (Signed)
Provider was contacted by Direce E McIntyre, RMA regarding patient's medication refill. Patient's medication to be e-prescribed to pharmacy of choice.

## 2021-08-22 NOTE — Progress Notes (Signed)
Provider was contacted by Direce E McIntyre, RMA regarding patient's medication refill. Patient's medication to be e-prescribed to pharmacy of choice.

## 2021-09-07 ENCOUNTER — Encounter (HOSPITAL_COMMUNITY): Payer: Self-pay | Admitting: Physician Assistant

## 2021-09-07 ENCOUNTER — Telehealth (INDEPENDENT_AMBULATORY_CARE_PROVIDER_SITE_OTHER): Payer: 59 | Admitting: Physician Assistant

## 2021-09-07 DIAGNOSIS — F900 Attention-deficit hyperactivity disorder, predominantly inattentive type: Secondary | ICD-10-CM | POA: Diagnosis not present

## 2021-09-07 DIAGNOSIS — F411 Generalized anxiety disorder: Secondary | ICD-10-CM | POA: Diagnosis not present

## 2021-09-07 DIAGNOSIS — F3342 Major depressive disorder, recurrent, in full remission: Secondary | ICD-10-CM

## 2021-09-10 MED ORDER — ESCITALOPRAM OXALATE 20 MG PO TABS: 20.0000 mg | ORAL_TABLET | Freq: Every day | ORAL | 2 refills | Status: DC

## 2021-09-10 MED ORDER — BUSPIRONE HCL 15 MG PO TABS: 15.0000 mg | ORAL_TABLET | Freq: Two times a day (BID) | ORAL | 2 refills | Status: DC

## 2021-09-10 MED ORDER — LISDEXAMFETAMINE DIMESYLATE 30 MG PO CAPS: 30.0000 mg | ORAL_CAPSULE | Freq: Every day | ORAL | 0 refills | Status: DC

## 2021-09-10 NOTE — Progress Notes (Signed)
BH MD/PA/NP OP Progress Note  Virtual Visit via Telephone Note  I connected with Madison Chavez on 09/10/21 at  3:30 PM EDT by telephone and verified that I am speaking with the correct person using two identifiers.  Location: Patient: Home Provider: Clinic   I discussed the limitations, risks, security and privacy concerns of performing an evaluation and management service by telephone and the availability of in person appointments. I also discussed with the patient that there may be a patient responsible charge related to this service. The patient expressed understanding and agreed to proceed.  Follow Up Instructions:  I discussed the assessment and treatment plan with the patient. The patient was provided an opportunity to ask questions and all were answered. The patient agreed with the plan and demonstrated an understanding of the instructions.   The patient was advised to call back or seek an in-person evaluation if the symptoms worsen or if the condition fails to improve as anticipated.  I provided 8 minutes of non-face-to-face time during this encounter.  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  Patient is currently being managed on the following medications:  Escitalopram 20 mg daily Buspirone 15 mg 2 times daily Lisdexamfetamine (Vyvanse) 30 mg daily  Patient reports that everything is going well for her and denies any issues or concerns regarding her current medication  regimen.  Patient denies experiencing any current adverse side effects from her medications.  Patient denies the need for dosage adjustments at this time and is requesting refills on all of her medications following the conclusion of the encounter.  Patient denies experiencing depressive symptoms but does endorse some anxiety.  Patient rates her anxiety an 8 out of 10 and attributes her main source of stress to her job.  Patient is alert and oriented x4, pleasant, calm, cooperative, and fully engaged in conversation during the encounter.  Patient endorses good mood.  Patient denies suicidal or homicidal ideations.  She further denied auditory or visual hallucinations and does not appear to be responding to internal/external stimuli.  Patient endorses good sleep and receives on average 5 to 7 hours of sleep each night.  Patient endorses good appetite and eats on average 2-3 meals per day.  Patient denies alcohol consumption.  Patient denies tobacco use but does engage in vaping patient endorses illicit drug use in the form of marijuana.  Visit Diagnosis:    ICD-10-CM   1. ADHD, predominantly inattentive type  F90.0 lisdexamfetamine (VYVANSE) 30 MG capsule    2. Generalized anxiety disorder  F41.1 escitalopram (LEXAPRO) 20 MG tablet    busPIRone (BUSPAR) 15 MG tablet    3. MDD (major depressive disorder), recurrent, in full remission (HCC)  F33.42 escitalopram (LEXAPRO) 20 MG tablet      Past Psychiatric History:  Generalized anxiety disorder ADHD, predominantly inattentive type Major depressive disorder  Past Medical History: History reviewed. No pertinent past medical history. History reviewed. No pertinent surgical history.  Family Psychiatric History:  Undiagnosed history of mood disorder, ADHD  Family History: History reviewed.  No pertinent family history.  Social History:  Social History   Socioeconomic History   Marital status: Single    Spouse name: Not on file   Number of  children: Not on file   Years of education: Not on file   Highest education level: Not on file  Occupational History   Not on file  Tobacco Use   Smoking status: Heavy Smoker    Types: E-cigarettes   Smokeless tobacco: Never  Substance and Sexual Activity   Alcohol use: Yes    Comment: social    Drug use: Yes    Types: Marijuana    Comment: joint a day    Sexual activity: Never  Other Topics Concern   Not on file  Social History Narrative   Not on file   Social Determinants of Health   Financial Resource Strain: Low Risk  (11/09/2019)   Overall Financial Resource Strain (CARDIA)    Difficulty of Paying Living Expenses: Not very hard  Food Insecurity: No Food Insecurity (11/09/2019)   Hunger Vital Sign    Worried About Running Out of Food in the Last Year: Never true    Ran Out of Food in the Last Year: Never true  Transportation Needs: No Transportation Needs (11/09/2019)   PRAPARE - Administrator, Civil Service (Medical): No    Lack of Transportation (Non-Medical): No  Physical Activity: Sufficiently Active (11/09/2019)   Exercise Vital Sign    Days of Exercise per Week: 4 days    Minutes of Exercise per Session: 150+ min  Stress: Stress Concern Present (11/09/2019)   [employer] of Occupational Health - Occupational Stress Questionnaire    Feeling of Stress : To some extent  Social Connections: Moderately Isolated (11/09/2019)   Social Connection and Isolation Panel [NHANES]    Frequency of Communication with Friends and Family: More than three times a week    Frequency of Social Gatherings with Friends and Family: Twice a week    Attends Religious Services: More than 4 times per year    Active Member of [employer] or Organizations: No    Attends Engineer, structural: Never    Marital Status: Never married    Allergies: No Known Allergies  Metabolic Disorder Labs: No results found for: "HGBA1C", "MPG" No results found for: "PROLACTIN" No  results found for: "CHOL", "TRIG", "HDL", "CHOLHDL", "VLDL", "LDLCALC" No results found for: "TSH"  Therapeutic Level Labs: No results found for: "LITHIUM" No results found for: "VALPROATE" No results found for: "CBMZ"  Current Medications: Current Outpatient Medications  Medication Sig Dispense Refill   busPIRone (BUSPAR) 15 MG tablet Take 1 tablet (15 mg total) by mouth 2 (two) times daily. 60 tablet 2   escitalopram (LEXAPRO) 20 MG tablet Take 1 tablet (20 mg total) by mouth daily. 30 tablet 2   HYDROcodone-acetaminophen (NORCO/VICODIN) 5-325 MG tablet Take 1-2 tablets by mouth every 6 (six) hours as needed. 6 tablet 0   [START ON 09/21/2021] lisdexamfetamine (VYVANSE) 30 MG capsule Take 1 capsule (30 mg total) by mouth daily. 30 capsule 0   ondansetron (ZOFRAN ODT) 4 MG disintegrating tablet Take 1 tablet (4 mg total) by mouth every 8 (eight) hours as needed for nausea or vomiting. 6 tablet 0   No current facility-administered medications for this visit.     Musculoskeletal: Strength & Muscle Tone: within normal limits Gait & Station: normal Patient leans: N/A  Psychiatric Specialty Exam: Review of Systems  Psychiatric/Behavioral:  Positive for decreased  concentration and sleep disturbance. Negative for dysphoric mood, hallucinations, self-injury and suicidal ideas. The patient is nervous/anxious. The patient is not hyperactive.     There were no vitals taken for this visit.There is no height or weight on file to calculate BMI.  General Appearance: Casual  Eye Contact:  Good  Speech:  Clear and Coherent and Normal Rate  Volume:  Normal  Mood:  Anxious and Euthymic  Affect:  Appropriate and Congruent  Thought Process:  Coherent, Goal Directed, and Descriptions of Associations: Intact  Orientation:  Full (Time, Place, and Person)  Thought Content: WDL   Suicidal Thoughts:  No  Homicidal Thoughts:  No  Memory:  Immediate;   Good Recent;   Good Remote;   Good  Judgement:   Good  Insight:  Good  Psychomotor Activity:  Normal  Concentration:  Concentration: Good and Attention Span: Good  Recall:  Good  Fund of Knowledge: Good  Language: Good  Akathisia:  No  Handed:  Right  AIMS (if indicated): not done  Assets:  Communication Skills Desire for Improvement Financial Resources/Insurance Housing Social Support  ADL's:  Intact  Cognition: WNL  Sleep:  Fair   Screenings: AUDIT    Advertising copywriter from 11/09/2019 in [facility]  Alcohol Use Disorder Identification Test Final Score (AUDIT) 3      GAD-7    Flowsheet Row Video [facility] 09/07/2021 [facility] Video Visit from 05/24/2021 in [facility] Video Visit from 04/12/2021 in [facility] Video Visit from 02/15/2021 in [facility] Video Visit from 12/14/2020 in [facility]  Total GAD-7 Score 11 5 9 11 13       PHQ2-9    Flowsheet Row Video Visit from 09/07/2021 in [facility] Video Visit from 05/24/2021 in [facility] Video Visit from 04/12/2021 in [facility] Video Visit from 02/15/2021 in [facility] Video Visit from 12/14/2020 in [facility]  PHQ-2 Total Score 2 1 2 2 3   PHQ-9 Total Score 14 -- 11 13 15       Flowsheet Row Video Visit from 09/07/2021 in [facility] Video Visit from 05/24/2021 in [facility] Video Visit from 04/12/2021 in [facility]  C-SSRS RISK CATEGORY No Risk No Risk No Risk        Assessment and Plan:   Rex Magee. 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  Patient reports no issues or concerns regarding her current medication regimen.  Patient denies experiencing depressive symptoms but does endorse anxiety attributed to her job.  Patient denies the need for dosage adjustments at this time and is requesting refills on all of her medications following the conclusion of the encounter.  Patient's medications to be e-prescribed to pharmacy of choice.  Collaboration of Care: Collaboration of Care: Medication Management AEB provider managing patient's psychiatric medications and Psychiatrist AEB patient being followed by mental health provider  Patient/Guardian was advised Release of Information must be obtained prior to any record release in order to collaborate their care with an outside provider. Patient/Guardian was advised  if they have not already done so to contact the registration department to sign all necessary forms in order for [place] to release information regarding their care.   Consent: Patient/Guardian gives verbal consent for treatment and assignment of benefits for services provided during this visit. Patient/Guardian expressed understanding and agreed to proceed.   1. ADHD, predominantly inattentive type  - lisdexamfetamine (VYVANSE) 30 MG capsule; Take 1 capsule (30 mg total) by mouth daily.  Dispense: 30 capsule; Refill: 0  2. Generalized anxiety disorder  - escitalopram (LEXAPRO) 20 MG tablet; Take 1 tablet (20 mg total) by mouth daily.  Dispense: 30 tablet; Refill: 2 - busPIRone (BUSPAR) 15 MG tablet; Take 1 tablet (15 mg total) by mouth 2 (two) times daily.  Dispense: 60 tablet; Refill: 2  3. MDD (major depressive disorder), recurrent, in full remission (HCC)  - escitalopram (LEXAPRO) 20 MG tablet; Take 1 tablet (20 mg total) by mouth daily.   Dispense: 30 tablet; Refill: 2  Patient to follow-up in 3 months Provider spent a total of 8 minutes with the patient/reviewing patient's chart  Meta Hatchet, PA 09/10/2021, 3:27 PM

## 2021-10-26 ENCOUNTER — Telehealth (HOSPITAL_COMMUNITY): Payer: Self-pay | Admitting: *Deleted

## 2021-10-26 ENCOUNTER — Other Ambulatory Visit (HOSPITAL_COMMUNITY): Payer: Self-pay | Admitting: Physician Assistant

## 2021-10-26 DIAGNOSIS — F900 Attention-deficit hyperactivity disorder, predominantly inattentive type: Secondary | ICD-10-CM

## 2021-10-26 MED ORDER — LISDEXAMFETAMINE DIMESYLATE 30 MG PO CAPS: 30.0000 mg | ORAL_CAPSULE | Freq: Every day | ORAL | 0 refills | Status: DC

## 2021-10-26 NOTE — Telephone Encounter (Signed)
Provider was contacted by Madison Chavez, RMA guarding patient's medication refill.  Patient's medication to be e-prescribed to pharmacy of choice.

## 2021-10-26 NOTE — Telephone Encounter (Signed)
PATIENT IS SCHEDULED FOR Appt 12/07/21 WITH Eddie Nwoko lisdexamfetamine (VYVANSE) 30 MG capsule Take 1 capsule (30 mg total) by mouth daily

## 2021-10-26 NOTE — Progress Notes (Signed)
Provider was contacted by Direce E. McIntyre, RMA guarding patient's medication refill.  Patient's medication to be e-prescribed to pharmacy of choice.

## 2021-11-27 ENCOUNTER — Telehealth (HOSPITAL_COMMUNITY): Payer: Self-pay | Admitting: *Deleted

## 2021-11-27 ENCOUNTER — Other Ambulatory Visit (HOSPITAL_COMMUNITY): Payer: Self-pay | Admitting: Psychiatry

## 2021-11-27 DIAGNOSIS — F900 Attention-deficit hyperactivity disorder, predominantly inattentive type: Secondary | ICD-10-CM

## 2021-11-27 MED ORDER — LISDEXAMFETAMINE DIMESYLATE 30 MG PO CAPS: 30.0000 mg | ORAL_CAPSULE | Freq: Every day | ORAL | 0 refills | Status: DC

## 2021-11-27 NOTE — Telephone Encounter (Signed)
Former patient of Otila Back who is currently scheduled for an appointment on 12/07/21 & not with a current provider-- Patient is Requesting Refill lisdexamfetamine (VYVANSE) 30 MG capsule

## 2021-11-27 NOTE — Progress Notes (Signed)
Contacted by CMA that patient is requesting medication refill for Vyvanse 30 mg daily. Previous provider no longer with clinic and patient needs to be rescheduled with new provider. PDMP reviewed; last filled 10/26/21. As covering provider, will send in 30 day script and request that patient be scheduled with new provider.  Daine Gip, MD 11/27/21

## 2021-12-07 ENCOUNTER — Telehealth (HOSPITAL_COMMUNITY): Payer: 59 | Admitting: Physician Assistant

## 2021-12-19 ENCOUNTER — Telehealth (INDEPENDENT_AMBULATORY_CARE_PROVIDER_SITE_OTHER): Payer: 59 | Admitting: Student in an Organized Health Care Education/Training Program

## 2021-12-19 ENCOUNTER — Encounter (HOSPITAL_COMMUNITY): Payer: Self-pay | Admitting: Student in an Organized Health Care Education/Training Program

## 2021-12-19 DIAGNOSIS — F411 Generalized anxiety disorder: Secondary | ICD-10-CM | POA: Diagnosis not present

## 2021-12-19 DIAGNOSIS — F3342 Major depressive disorder, recurrent, in full remission: Secondary | ICD-10-CM | POA: Diagnosis not present

## 2021-12-19 DIAGNOSIS — F900 Attention-deficit hyperactivity disorder, predominantly inattentive type: Secondary | ICD-10-CM | POA: Diagnosis not present

## 2021-12-19 MED ORDER — ATOMOXETINE HCL 40 MG PO CAPS: 40.0000 mg | ORAL_CAPSULE | Freq: Every day | ORAL | 2 refills | Status: DC

## 2021-12-19 MED ORDER — ESCITALOPRAM OXALATE 20 MG PO TABS: 20.0000 mg | ORAL_TABLET | Freq: Every day | ORAL | 2 refills | Status: DC

## 2021-12-19 NOTE — Progress Notes (Signed)
BH MD/PA/NP OP Progress Note  12/19/2021 6:00 PM Madison Chavez  MRN:  [id number]  Chief Complaint: No chief complaint on file.  Virtual Visit via Video Note  I connected with [name] on 12/19/21 at  4:30 PM EST by a video enabled telemedicine application and verified that I am speaking with the correct person using two identifiers.  Location: Patient: Set designer, parked Provider: Office   I discussed the limitations of evaluation and management by telemedicine and the availability of in person appointments. The patient expressed understanding and agreed to proceed.  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  Patient is currently being managed on the following medications:  Escitalopram 20 mg daily Buspirone 15 mg 2 times daily Lisdexamfetamine (Vyvanse) 30 mg daily    Patient reports she has been doing "good" since last visit, but she still believes anxiety has been a problem. Patient reports that she did not feel that the buspar was helpful in the past. Patient reports that her job is her main stressor, she keeps getting promoted but the promotions come with being overwhelmed by the tasks and responsibilities. Patient reports she is able to control the anxiety at work, but she lets it out when at home and she describes herself feeling overwhelmed. Patient reports she is not having panic attacks, but is constantly feeling on edge. Patient reports that she does think that the anxiety makes it more difficult to fall asleep, and she will take an old rx of hydroxyzine. Patient reports that the medication is too sedating for a PRN meds for  anxiety.    Patient reports that she has not been having low mood recently. Patient that she is not having anhedonia. Patient reports that her concentration is possible worse than normal but thinks that her anxiety is likely contributing to this. Patient reports her appetite has been stable and her energy has been "ok." Patient reports that she gets better over the course of the day. Patient denies SI, HI, AVH.  Patient reports that she vapes nicotine and endorses social THC use. Patient denies Etoh use.   Patient reports that she was dx with ADHD online by a physician when she was 31 yo. Patient is not sure the Vyvanse is really helping with her concentration and also reports that she tried and failed Adderall in the past.    I discussed the assessment and treatment plan with the patient. The patient was provided an opportunity to ask questions and all were answered. The patient agreed with the plan and demonstrated an understanding of the instructions.   The patient was advised to call back or seek an in-person evaluation if the symptoms worsen or if the condition fails to improve as anticipated.  I provided 25 minutes of non-face-to-face time during this encounter.   Bobbye Morton, MD  Visit Diagnosis:    ICD-10-CM   1. Generalized anxiety disorder  F41.1 escitalopram (LEXAPRO) 20 MG tablet    2. ADHD, predominantly inattentive type  F90.0 atomoxetine (STRATTERA) 40 MG capsule    Ambulatory referral to Psychology    3. MDD (major depressive disorder), recurrent, in full remission (HCC)  F33.42 escitalopram (LEXAPRO) 20 MG tablet      Past Psychiatric History:  Generalized anxiety disorder ADHD, predominantly inattentive type Major depressive disorder   Past Medical History: No past medical history on file. No past surgical history on file.  Family Psychiatric History:   ADHD   Family History: No family history on file.  Social History:  Social History   Socioeconomic  History   Marital status: Single    Spouse name: Not on file   Number of children: Not on file   Years of education: Not on file   Highest education level: Not on file  Occupational History   Not on file  Tobacco Use   Smoking status: Heavy Smoker    Types: E-cigarettes   Smokeless tobacco: Never  Substance and Sexual Activity   Alcohol use: Yes    Comment: social    Drug use: Yes    Types: Marijuana    Comment: joint a day    Sexual activity: Never  Other Topics Concern   Not on file  Social History Narrative   Not on file   Social Determinants of Health   Financial Resource Strain: Low Risk  (11/09/2019)   Overall Financial Resource Strain (CARDIA)    Difficulty of Paying Living Expenses: Not very hard  Food Insecurity: No Food Insecurity (11/09/2019)   Hunger Vital Sign    Worried About Running Out of Food in the Last Year: Never true    Ran Out of Food in the Last Year: Never true  Transportation Needs: No Transportation Needs (11/09/2019)   PRAPARE - Administrator, Civil Service (Medical): No    Lack of Transportation (Non-Medical): No  Physical Activity: Sufficiently Active (11/09/2019)   Exercise Vital Sign    Days of Exercise per Week: 4 days    Minutes of Exercise per Session: 150+ min  Stress: Stress Concern Present (11/09/2019)   [employer] of Occupational Health - Occupational Stress Questionnaire    Feeling of Stress : To some extent  Social Connections: Moderately Isolated (11/09/2019)   Social Connection and Isolation Panel [NHANES]    Frequency of Communication with Friends and Family: More than three times a week    Frequency of Social Gatherings with Friends and Family: Twice a week    Attends Religious Services: More than 4 times per year    Active Member of [employer] or Organizations: No    Attends Engineer, structural: Never    Marital Status: Never married    Allergies: No Known Allergies  Metabolic Disorder Labs: No  results found for: "HGBA1C", "MPG" No results found for: "PROLACTIN" No results found for: "CHOL", "TRIG", "HDL", "CHOLHDL", "VLDL", "LDLCALC" No results found for: "TSH"  Therapeutic Level Labs: No results found for: "LITHIUM" No results found for: "VALPROATE" No results found for: "CBMZ"  Current Medications: Current Outpatient Medications  Medication Sig Dispense Refill   atomoxetine (STRATTERA) 40 MG capsule Take 1 capsule (40 mg total) by mouth daily. 30 capsule 2   escitalopram (LEXAPRO) 20 MG tablet Take 1 tablet (20 mg total) by mouth daily. 30 tablet 2   HYDROcodone-acetaminophen (NORCO/VICODIN) 5-325 MG tablet Take 1-2 tablets by mouth every 6 (six) hours as needed. 6 tablet 0   ondansetron (ZOFRAN ODT) 4 MG disintegrating tablet Take 1 tablet (4 mg total) by mouth every 8 (eight) hours as needed for nausea or vomiting. 6 tablet 0   No current facility-administered medications for this visit.     Musculoskeletal: Defer  Psychiatric Specialty Exam: Review of Systems  There were no vitals  taken for this visit.There is no height or weight on file to calculate BMI.  General Appearance: Casual  Eye Contact:  Good  Speech:  Clear and Coherent  Volume:  Normal  Mood:  Euthymic  Affect:  Appropriate  Thought Process:  Coherent  Orientation:  Full (Time, Place, and Person)  Thought Content: Logical   Suicidal Thoughts:  No  Homicidal Thoughts:  No  Memory:  Immediate;   Good Recent;   Good  Judgement:  Good  Insight:  Fair  Psychomotor Activity:  Normal  Concentration:  Concentration: Fair  Recall:  NA  Fund of Knowledge: Good  Language: Good  Akathisia:  No  Handed:    AIMS (if indicated): not done  Assets:  Manufacturing systems engineer Desire for Improvement Housing Resilience Social Support Transportation  ADL's:  Intact  Cognition: WNL  Sleep:  Fair   Screenings: AUDIT    Advertising copywriter from 11/09/2019 in [facility]   Alcohol Use Disorder Identification Test Final Score (AUDIT) 3      GAD-7    Flowsheet Row Video Visit from 09/07/2021 in [facility] Video Visit from 05/24/2021 in [facility] Video Visit from 04/12/2021 in [facility] Video Visit from 02/15/2021 in [facility] Video Visit from 12/14/2020 in [facility]  Total GAD-7 Score 11 5 9 11 13       PHQ2-9    Flowsheet Row Video Visit from 09/07/2021 in [facility] Video Visit from 05/24/2021 in [facility] Video Visit from 04/12/2021 in [facility] Video Visit from 02/15/2021 in [facility] Video Visit from 12/14/2020 in [facility]  PHQ-2 Total Score 2 1 2 2 3   PHQ-9 Total Score 14 -- 11 13 15       Flowsheet Row Video Visit from 09/07/2021 in [facility] Video Visit from 05/24/2021 in [facility] Video Visit from 04/12/2021 in [facility]  C-SSRS RISK CATEGORY No Risk No Risk No Risk        Assessment and Plan: Madison Chavez. 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  Based on assessment today, patient's dysphoric mood appears to have significantly improved with Lexapro however she continues to have some residual anxiety.  Conversation was held with patient that in combination with her external stressors will also be on a stimulant both factors could be worsening her anxiety.  Patient also endorsed that she did not feel that the Vyvanse was drastically helping her concentration.  Patient had enough insight to agree that  her anxiety is likely contributing to her poor concentration.  We will recommend that patient get in person assessment for ADHD as it is possible that her ADHD was misdiagnosed GAD.  Patient was agreeable to discontinuing Vyvanse in favor of Strattera which may help with both mood and concentration.  GAD MDD -Continue Lexapro 20 mg  Reported ADHD, inattentive type - Discontinue Vyvanse 30 mg - Start Strattera 40 mg daily - Referral sent [facility] for ADHD testing  Collaboration of Care: Collaboration of Care:   Patient/Guardian was advised Release of Information must be obtained prior to any record release in order to collaborate  their care with an outside provider. Patient/Guardian was advised if they have not already done so to contact the registration department to sign all necessary forms in order for us to release information regarding their care.   Consent: Patient/Guardian gives verbal consent for treatment and assignment of benefits for services provided during this visit. Patient/Guardian expressed understanding and agreed to proceed.   PGY-3 Bobbye MortonJai B Sweta Halseth, MD 12/19/2021, 6:00 PM
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
## 2022-02-08 ENCOUNTER — Telehealth (INDEPENDENT_AMBULATORY_CARE_PROVIDER_SITE_OTHER): Payer: 59 | Admitting: Student in an Organized Health Care Education/Training Program

## 2022-02-08 DIAGNOSIS — F411 Generalized anxiety disorder: Secondary | ICD-10-CM

## 2022-02-08 MED ORDER — FLUOXETINE HCL 10 MG PO CAPS: 10.0000 mg | ORAL_CAPSULE | Freq: Every day | ORAL | 4 refills | Status: DC

## 2022-02-08 MED ORDER — HYDROXYZINE HCL 10 MG PO TABS: 10.0000 mg | ORAL_TABLET | Freq: Every evening | ORAL | 4 refills | Status: DC | PRN

## 2022-02-08 NOTE — Progress Notes (Signed)
John Day MD/PA/NP OP Progress Note  02/08/2022 3:31 PM Adalena Keonda Dow  MRN:  [id number]  Chief Complaint:  Chief Complaint  Patient presents with   Follow-up   Virtual Visit via Video Note  I connected with Marsa Aris on 02/08/22 at  3:00 PM EST by a video enabled telemedicine application and verified that I am speaking with the correct person using two identifiers.  Location: Patient: Work, in office Provider: Office   I discussed the limitations of evaluation and management by telemedicine and the availability of in person appointments. The patient expressed understanding and agreed to proceed.  History of Present Illness: Dariyah M. 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  Patient is currently being managed on the following medications:  Escitalopram 20 mg daily Strattera 40 mg daily   Patient reports that she is doing "good." Patient reports that she feels like she has been more irritable and "scattered" but feels like she has more energy in the evenings and is not "crashing" like she used to.  Patient reports that she feels like in terms of tasks that require time management she still really struggles with. Patient reports that at work she is struggling to stay on task a bit. Patient reports she is easily distractible. Patient reports that no one has said anything but she is concerned because she has caught herself making some mistakes.   Patient reports that she feels more easily triggered and on edge. Patient reports that she does constantly feel anxious but does not think that this has worsened. Patient reports that she has frequent night time awakening. Patient reports  she wakes up 3-4 times/ night but is able go back to sleep fairly fast. Patient reports that she grinds her teeth and she has a very restless in her sleep with tossing/ turning and kicking.   Patient reports her appetite is ok. Patient denies feelings of hopeless/worthless/ guilt. Patient reports that she has had some anhedonia lately.   Patient does not endorse SI, HI or AVH. I discussed the assessment and treatment plan with the patient. The patient was provided an opportunity to ask questions and all were answered. The patient agreed with the plan and demonstrated an understanding of the instructions.   The patient was advised to call back or seek an in-person evaluation if the symptoms worsen or if the condition fails to improve as anticipated.  I provided 25 minutes of non-face-to-face time during this encounter.   Freida Busman, MD  Visit Diagnosis:    ICD-10-CM   1. Generalized anxiety disorder  F41.1 FLUoxetine (PROZAC) 10 MG capsule    hydrOXYzine (ATARAX) 10 MG tablet      Past Psychiatric History: Generalized anxiety disorder ADHD, predominantly inattentive type Major depressive disorder  Last visit:  12/2021-patient endorsed concern regarding concentration despite being on Vyvanse and feeling as though it was not beneficial.  Patient also endorsed that she felt as though she was crashing at the end of her day due to taking Vyvanse in the morning.  Patient did feel her anxiety was also contributing to her poor concentration.  Decision was made that it is possible patient does not need to be on a stimulant, corrected for ADHD.  Patient reports she was diagnosed online at 68 by  an physician for ADHD and otherwise has not had testing.  Patient also had failed a previous trial of Adderall.  Due to this reported history, it was decided to put patient on Strattera a nonstimulant and a referral was sent.  Also decided to discontinue patient's BuSpar as she did not feel it was extremely  beneficial but had found Lexapro beneficial when she is started at.   Past Medical History: No past medical history on file. No past surgical history on file.  Family Psychiatric History: Unknown  Family History: No family history on file.  Social History:  Social History   Socioeconomic History   Marital status: Single    Spouse name: Not on file   Number of children: Not on file   Years of education: Not on file   Highest education level: Not on file  Occupational History   Not on file  Tobacco Use   Smoking status: Heavy Smoker    Types: E-cigarettes   Smokeless tobacco: Never  Substance and Sexual Activity   Alcohol use: Yes    Comment: social    Drug use: Yes    Types: Marijuana    Comment: joint a day    Sexual activity: Never  Other Topics Concern   Not on file  Social History Narrative   Not on file   Social Determinants of Health   Financial Resource Strain: Low Risk  (11/09/2019)   Overall Financial Resource Strain (CARDIA)    Difficulty of Paying Living Expenses: Not very hard  Food Insecurity: No Food Insecurity (11/09/2019)   Hunger Vital Sign    Worried About Running Out of Food in the Last Year: Never true    Ran Out of Food in the Last Year: Never true  Transportation Needs: No Transportation Needs (11/09/2019)   PRAPARE - Hydrologist (Medical): No    Lack of Transportation (Non-Medical): No  Physical Activity: Sufficiently Active (11/09/2019)   Exercise Vital Sign    Days of Exercise per Week: 4 days    Minutes of Exercise per Session: 150+ min  Stress: Stress Concern Present (11/09/2019)   [place]    Feeling of Stress : To some extent  Social Connections: Moderately Isolated (11/09/2019)   Social Connection and Isolation Panel [NHANES]    Frequency of Communication with Friends and Family: More than three times a week    Frequency of Social  Gatherings with Friends and Family: Twice a week    Attends Religious Services: More than 4 times per year    Active Member of [employer] or Organizations: No    Attends Music therapist: Never    Marital Status: Never married    Allergies: No Known Allergies  Metabolic Disorder Labs: No results found for: "HGBA1C", "MPG" No results found for: "PROLACTIN" No results found for: "CHOL", "TRIG", "HDL", "CHOLHDL", "VLDL", "LDLCALC" No results found for: "TSH"  Therapeutic Level Labs: No results found for: "LITHIUM" No results found for: "VALPROATE" No results found for: "CBMZ"  Current Medications: Current Outpatient Medications  Medication Sig Dispense Refill   FLUoxetine (PROZAC) 10 MG capsule Take 1 capsule (10 mg total) by mouth daily. 30 capsule 4   hydrOXYzine (ATARAX) 10 MG tablet Take 1 tablet (10 mg total) by mouth at bedtime as needed for anxiety. 30 tablet 4   atomoxetine (STRATTERA) 40 MG capsule Take 1 capsule (40 mg total) by mouth daily. 30 capsule 2  escitalopram (LEXAPRO) 20 MG tablet Take 1 tablet (20 mg total) by mouth daily. 30 tablet 2   HYDROcodone-acetaminophen (NORCO/VICODIN) 5-325 MG tablet Take 1-2 tablets by mouth every 6 (six) hours as needed. 6 tablet 0   ondansetron (ZOFRAN ODT) 4 MG disintegrating tablet Take 1 tablet (4 mg total) by mouth every 8 (eight) hours as needed for nausea or vomiting. 6 tablet 0   No current facility-administered medications for this visit.     Musculoskeletal: Defer Psychiatric Specialty Exam: Review of Systems  Psychiatric/Behavioral:  Positive for decreased concentration and sleep disturbance. Negative for dysphoric mood, hallucinations and suicidal ideas. The patient is nervous/anxious.     There were no vitals taken for this visit.There is no height or weight on file to calculate BMI.  General Appearance: Casual  Eye Contact:  Good  Speech:  Clear and Coherent  Volume:  Normal  Mood:  Euthymic  Affect:   Appropriate  Thought Process:  Coherent  Orientation:  Full (Time, Place, and Person)  Thought Content: Logical   Suicidal Thoughts:  No  Homicidal Thoughts:  No  Memory:  Immediate;   Good Recent;   Good  Judgement:  Fair  Insight:  Good  Psychomotor Activity:  NA  Concentration:  Concentration: Good  Recall:  NA  Fund of Knowledge: Good  Language: Good  Akathisia:  NA  Handed:    AIMS (if indicated): not done  Assets:  Communication Skills Desire for Improvement Housing Resilience Transportation Vocational/Educational  ADL's:  Intact  Cognition: WNL  Sleep:  Poor   Screenings: AUDIT    Advertising copywriter from 11/09/2019 in [facility]  Alcohol Use Disorder Identification Test Final Score (AUDIT) 3      GAD-7    Flowsheet Row Video Visit from 09/07/2021 in [facility] Video Visit from 05/24/2021 in [facility] Video Visit from 04/12/2021 in [facility] Video Visit from 02/15/2021 in [facility] Video Visit from 12/14/2020 in [facility]  Total GAD-7 Score 11 5 9 11 13       PHQ2-9    Flowsheet Row Video Visit from 09/07/2021 in [facility] Video Visit from 05/24/2021 in [facility] Video Visit from 04/12/2021 in [facility] Video Visit from 02/15/2021 in [facility] Video Visit from 12/14/2020 in [facility]  PHQ-2 Total Score 2 1 2 2 3   PHQ-9 Total Score 14 -- 11 13 15       Flowsheet Row Video Visit from 09/07/2021 in [facility] Video Visit from 05/24/2021 in [facility] Video Visit from 04/12/2021 in [facility]  C-SSRS RISK CATEGORY No Risk No Risk No Risk         Assessment and Plan: Manuelita Moxon. 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   Based on assessment today it actually appears as the patient's anxiety is not well-controlled.  Patient is endorsing multiple symptoms of generalized anxiety such as poor sleep, increased irritability and poor concentration most of the symptoms are secondary to rapid thoughts and worrying.  Patient also endorses some somatization of anxiety as well.  Patient has been on Lexapro for 3-4 years and it does appear that  the medication may have "pooped out".  Patient is still looking into getting psychological testing as there is a long wait list.  I would like to try patient on a different SSRI as she has no other previous trials and her poor concentration may be secondary to GAD.  We will continue referral for ADHD.  GAD Hx MDD - Decrease Lexapro to 10 mg x 7 days then discontinue - Start Prozac 10 mg after discontinuing Lexapro 10 mg  Reported ADHD, inattentive type - Continue Strattera 40 mg daily  Follow-up in 2-3 months  Collaboration of Care: Collaboration of Care:   Patient/Guardian was advised Release of Information must be obtained prior to any record release in order to collaborate their care with an outside provider. Patient/Guardian was advised if they have not already done so to contact the registration department to sign all necessary forms in order for [place] to release information regarding their care.   Consent: Patient/Guardian gives verbal consent for treatment and assignment of benefits for services provided during this visit. Patient/Guardian expressed understanding and agreed to proceed.   PGY-3 Freida Busman, MD 02/08/2022, 3:31 PM

## 2022-04-02 ENCOUNTER — Telehealth (HOSPITAL_COMMUNITY): Payer: Self-pay | Admitting: Student in an Organized Health Care Education/Training Program

## 2022-04-03 ENCOUNTER — Other Ambulatory Visit (HOSPITAL_COMMUNITY): Payer: Self-pay | Admitting: Student in an Organized Health Care Education/Training Program

## 2022-04-03 DIAGNOSIS — F900 Attention-deficit hyperactivity disorder, predominantly inattentive type: Secondary | ICD-10-CM

## 2022-04-03 DIAGNOSIS — F411 Generalized anxiety disorder: Secondary | ICD-10-CM

## 2022-04-03 MED ORDER — ATOMOXETINE HCL 40 MG PO CAPS: 40.0000 mg | ORAL_CAPSULE | Freq: Every day | ORAL | 2 refills | Status: AC

## 2022-04-03 MED ORDER — HYDROXYZINE HCL 10 MG PO TABS: 10.0000 mg | ORAL_TABLET | Freq: Every evening | ORAL | 4 refills | Status: AC | PRN

## 2022-04-03 MED ORDER — FLUOXETINE HCL 10 MG PO CAPS: 10.0000 mg | ORAL_CAPSULE | Freq: Every day | ORAL | 4 refills | Status: AC

## 2022-04-03 NOTE — Telephone Encounter (Signed)
Done

## 2022-05-11 ENCOUNTER — Telehealth (HOSPITAL_COMMUNITY): Payer: 59 | Admitting: Student in an Organized Health Care Education/Training Program

## 2022-05-21 ENCOUNTER — Encounter (HOSPITAL_COMMUNITY): Payer: Self-pay

## 2022-05-21 ENCOUNTER — Telehealth (HOSPITAL_COMMUNITY): Payer: 59 | Admitting: Student in an Organized Health Care Education/Training Program

## 2022-05-21 NOTE — Progress Notes (Deleted)
Virtual Visit via Video Note  I connected with Madison Chavez on 05/21/22 at  2:00 PM EDT by a video enabled telemedicine application and verified that I am speaking with the correct person using two identifiers.  Location: Patient: *** Provider: ***   I discussed the limitations of evaluation and management by telemedicine and the availability of in person appointments. The patient expressed understanding and agreed to proceed.    I discussed the assessment and treatment plan with the patient. The patient was provided an opportunity to ask questions and all were answered. The patient agreed with the plan and demonstrated an understanding of the instructions.   The patient was advised to call back or seek an in-person evaluation if the symptoms worsen or if the condition fails to improve as anticipated.  I provided *** minutes of non-face-to-face time during this encounter.   Bobbye Morton, MD  [facility] MD/PA/NP OP Progress Note  05/21/2022 12:56 PM [name]  MRN:  [id number]  Chief Complaint: No chief complaint on file.  HPI: Madison Chavez. 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  Patient is currently being managed on the following medications:  Prozac 10mg  daily Strattera 40 mg daily Visit Diagnosis: No diagnosis found.  Past Psychiatric History: Generalized anxiety disorder ADHD, predominantly inattentive type Major depressive disorder   Last visit:  12/2021-patient endorsed concern regarding concentration despite being on Vyvanse and feeling as though it was not beneficial.  Patient also endorsed that she felt as though she was crashing at the end of her day due to  taking Vyvanse in the morning.  Patient did feel her anxiety was also contributing to her poor concentration.  Decision was made that it is possible patient does not need to be on a stimulant, corrected for ADHD.  Patient reports she was diagnosed online at 17 by an physician for ADHD and otherwise has not had testing.  Patient also had failed a previous trial of Adderall.  Due to this reported history, it was decided to put patient on Strattera a nonstimulant and a referral was sent.  Also decided to discontinue patient's BuSpar as she did not feel it was extremely beneficial but had found Lexapro beneficial when she is started at. 02/2022- appears as the patient's anxiety is not well-controlled. On Lexapro for 3-4 years and it does appear that the medication may have "pooped out". Patient is still looking into getting psychological testing as there is a long wait list. I would like to try patient on a different SSRI as she has no other previous trials  Past Medical History: No past medical history on file. No past surgical history on file.  Family Psychiatric History: Unknown   Family History: No family history on file.  Social History:  Social History   Socioeconomic History   Marital status: Single    Spouse name: Not on file   Number of children: Not on file   Years of education: Not on file   Highest education level: Not on file  Occupational History   Not on file  Tobacco Use   Smoking status: Heavy Smoker    Types: E-cigarettes   Smokeless tobacco: Never  Substance and Sexual Activity   Alcohol use: Yes    Comment: social  Drug use: Yes    Types: Marijuana    Comment: joint a day    Sexual activity: Never  Other Topics Concern   Not on file  Social History Narrative   Not on file   Social Determinants of Health   Financial Resource Strain: Low Risk  (11/09/2019)   Overall Financial Resource Strain (CARDIA)    Difficulty of Paying Living Expenses: Not very hard  Food  Insecurity: No Food Insecurity (11/09/2019)   Hunger Vital Sign    Worried About Running Out of Food in the Last Year: Never true    Ran Out of Food in the Last Year: Never true  Transportation Needs: No Transportation Needs (11/09/2019)   PRAPARE - Administrator, Civil Service (Medical): No    Lack of Transportation (Non-Medical): No  Physical Activity: Sufficiently Active (11/09/2019)   Exercise Vital Sign    Days of Exercise per Week: 4 days    Minutes of Exercise per Session: 150+ min  Stress: Stress Concern Present (11/09/2019)   [employer] of Occupational Health - Occupational Stress Questionnaire    Feeling of Stress : To some extent  Social Connections: Moderately Isolated (11/09/2019)   Social Connection and Isolation Panel [NHANES]    Frequency of Communication with Friends and Family: More than three times a week    Frequency of Social Gatherings with Friends and Family: Twice a week    Attends Religious Services: More than 4 times per year    Active Member of [employer] or Organizations: No    Attends Engineer, structural: Never    Marital Status: Never married    Allergies: No Known Allergies  Metabolic Disorder Labs: No results found for: "HGBA1C", "MPG" No results found for: "PROLACTIN" No results found for: "CHOL", "TRIG", "HDL", "CHOLHDL", "VLDL", "LDLCALC" No results found for: "TSH"  Therapeutic Level Labs: No results found for: "LITHIUM" No results found for: "VALPROATE" No results found for: "CBMZ"  Current Medications: Current Outpatient Medications  Medication Sig Dispense Refill   atomoxetine (STRATTERA) 40 MG capsule Take 1 capsule (40 mg total) by mouth daily. 30 capsule 2   FLUoxetine (PROZAC) 10 MG capsule Take 1 capsule (10 mg total) by mouth daily. 30 capsule 4   HYDROcodone-acetaminophen (NORCO/VICODIN) 5-325 MG tablet Take 1-2 tablets by mouth every 6 (six) hours as needed. 6 tablet 0   hydrOXYzine (ATARAX) 10 MG tablet  Take 1 tablet (10 mg total) by mouth at bedtime as needed for anxiety. 30 tablet 4   ondansetron (ZOFRAN ODT) 4 MG disintegrating tablet Take 1 tablet (4 mg total) by mouth every 8 (eight) hours as needed for nausea or vomiting. 6 tablet 0   No current facility-administered medications for this visit.     Musculoskeletal: Strength & Muscle Tone: {desc; muscle tone:32375} Gait & Station: {PE GAIT ED ZOXW:96045} Patient leans: {Patient Leans:21022755}  Psychiatric Specialty Exam: Review of Systems  There were no vitals taken for this visit.There is no height or weight on file to calculate BMI.  General Appearance: {Appearance:22683}  Eye Contact:  {BHH EYE CONTACT:22684}  Speech:  {Speech:22685}  Volume:  {Volume (PAA):22686}  Mood:  {BHH MOOD:22306}  Affect:  {Affect (PAA):22687}  Thought Process:  {Thought Process (PAA):22688}  Orientation:  {BHH ORIENTATION (PAA):22689}  Thought Content: {Thought Content:22690}   Suicidal Thoughts:  {ST/HT (PAA):22692}  Homicidal Thoughts:  {ST/HT (PAA):22692}  Memory:  {BHH WUJWJX:91478}  Judgement:  {Judgement (PAA):22694}  Insight:  {Insight (PAA):22695}  Psychomotor Activity:  {  Psychomotor (PAA):22696}  Concentration:  {Concentration:21399}  Recall:  {BHH GOOD/FAIR/POOR:22877}  Fund of Knowledge: {BHH GOOD/FAIR/POOR:22877}  Language: {BHH GOOD/FAIR/POOR:22877}  Akathisia:  {BHH YES OR NO:22294}  Handed:  {Handed:22697}  AIMS (if indicated): {Desc; done/not:10129}  Assets:  {Assets (PAA):22698}  ADL's:  {BHH ZOX'W:96045}  Cognition: {chl bhh cognition:304700322}  Sleep:  {BHH GOOD/FAIR/POOR:22877}   Screenings: AUDIT    Advertising copywriter from 11/09/2019 in [facility]  Alcohol Use Disorder Identification Test Final Score (AUDIT) 3      GAD-7    Flowsheet Row Video Visit from 09/07/2021 in [facility] Video Visit from 05/24/2021 in [facility] Video Visit from 04/12/2021 in [facility] Video Visit from 02/15/2021 in [facility] Video Visit from 12/14/2020 in [facility]  Total GAD-7 Score 11 5 9 11 13       PHQ2-9    Flowsheet Row Video Visit from 09/07/2021 in [facility] Video Visit from 05/24/2021 in [facility] Video Visit from 04/12/2021 in [facility] Video Visit from 02/15/2021 in [facility] Video Visit from 12/14/2020 in [facility]  PHQ-2 Total Score 2 1 2 2 3   PHQ-9 Total Score 14 -- 11 13 15       Flowsheet Row Video Visit from 09/07/2021 in [facility] Video Visit from 05/24/2021 in [facility] Video Visit from 04/12/2021 in [facility]  C-SSRS RISK CATEGORY No Risk No Risk No Risk        Assessment and Plan: ***  Collaboration of Care: Collaboration of Care: [facility] OP Collaboration of Care:[id number]}  Patient/Guardian was advised Release of Information must be obtained prior to any record release in order to collaborate their care with an outside provider. Patient/Guardian was advised if they have not already done so to contact the registration department to sign all necessary forms in order for [place] to release information regarding their care.   Consent: Patient/Guardian gives verbal consent for treatment and assignment of benefits for services provided during this visit. Patient/Guardian expressed understanding and agreed to proceed.    Bobbye Morton, MD 05/21/2022, 12:56 PM
